# Patient Record
Sex: Female | Born: 1980 | Race: White | Hispanic: No | Marital: Single | Smoking: Never smoker
Health system: Southern US, Community
[De-identification: ages and names within clinical notes are randomized; demographics above are authoritative.]

## PROBLEM LIST (undated history)

## (undated) DIAGNOSIS — Z1371 Encounter for nonprocreative screening for genetic disease carrier status: Secondary | ICD-10-CM

## (undated) DIAGNOSIS — F419 Anxiety disorder, unspecified: Secondary | ICD-10-CM

## (undated) DIAGNOSIS — E282 Polycystic ovarian syndrome: Secondary | ICD-10-CM

## (undated) DIAGNOSIS — F329 Major depressive disorder, single episode, unspecified: Secondary | ICD-10-CM

## (undated) DIAGNOSIS — D134 Benign neoplasm of liver: Secondary | ICD-10-CM

## (undated) DIAGNOSIS — Z803 Family history of malignant neoplasm of breast: Secondary | ICD-10-CM

## (undated) DIAGNOSIS — F32A Depression, unspecified: Secondary | ICD-10-CM

## (undated) DIAGNOSIS — Z9189 Other specified personal risk factors, not elsewhere classified: Secondary | ICD-10-CM

## (undated) HISTORY — DX: Family history of malignant neoplasm of breast: Z80.3

## (undated) HISTORY — PX: CHOLECYSTECTOMY: SHX55

## (undated) HISTORY — DX: Depression, unspecified: F32.A

## (undated) HISTORY — DX: Anxiety disorder, unspecified: F41.9

## (undated) HISTORY — PX: LIVER RESECTION: SHX1977

## (undated) HISTORY — DX: Polycystic ovarian syndrome: E28.2

## (undated) HISTORY — PX: FINGER SURGERY: SHX640

## (undated) HISTORY — DX: Benign neoplasm of liver: D13.4

---
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
## 2019-03-05 DIAGNOSIS — M9902 Segmental and somatic dysfunction of thoracic region: Secondary | ICD-10-CM | POA: Diagnosis not present

## 2019-03-05 DIAGNOSIS — M5431 Sciatica, right side: Secondary | ICD-10-CM | POA: Diagnosis not present

## 2019-03-05 DIAGNOSIS — M546 Pain in thoracic spine: Secondary | ICD-10-CM | POA: Diagnosis not present

## 2019-03-05 DIAGNOSIS — M9903 Segmental and somatic dysfunction of lumbar region: Secondary | ICD-10-CM | POA: Diagnosis not present

## 2019-03-07 ENCOUNTER — Ambulatory Visit: Payer: Self-pay | Admitting: Obstetrics and Gynecology

## 2019-03-09 DIAGNOSIS — M9903 Segmental and somatic dysfunction of lumbar region: Secondary | ICD-10-CM | POA: Diagnosis not present

## 2019-03-09 DIAGNOSIS — M5431 Sciatica, right side: Secondary | ICD-10-CM | POA: Diagnosis not present

## 2019-03-09 DIAGNOSIS — M546 Pain in thoracic spine: Secondary | ICD-10-CM | POA: Diagnosis not present

## 2019-03-09 DIAGNOSIS — M9902 Segmental and somatic dysfunction of thoracic region: Secondary | ICD-10-CM | POA: Diagnosis not present

## 2019-03-12 DIAGNOSIS — M9903 Segmental and somatic dysfunction of lumbar region: Secondary | ICD-10-CM | POA: Diagnosis not present

## 2019-03-12 DIAGNOSIS — M546 Pain in thoracic spine: Secondary | ICD-10-CM | POA: Diagnosis not present

## 2019-03-12 DIAGNOSIS — M9902 Segmental and somatic dysfunction of thoracic region: Secondary | ICD-10-CM | POA: Diagnosis not present

## 2019-03-12 DIAGNOSIS — M5431 Sciatica, right side: Secondary | ICD-10-CM | POA: Diagnosis not present

## 2019-03-15 ENCOUNTER — Encounter: Payer: Self-pay | Admitting: Obstetrics and Gynecology

## 2019-03-15 ENCOUNTER — Other Ambulatory Visit: Payer: Self-pay

## 2019-03-15 ENCOUNTER — Ambulatory Visit (INDEPENDENT_AMBULATORY_CARE_PROVIDER_SITE_OTHER): Payer: Federal, State, Local not specified - PPO | Admitting: Obstetrics and Gynecology

## 2019-03-15 ENCOUNTER — Other Ambulatory Visit (HOSPITAL_COMMUNITY)
Admission: RE | Admit: 2019-03-15 | Discharge: 2019-03-15 | Disposition: A | Discharge status: Home / Self Care | Payer: Federal, State, Local not specified - PPO | Source: Ambulatory Visit | Attending: Obstetrics and Gynecology | Admitting: Obstetrics and Gynecology

## 2019-03-15 VITALS — BP 110/70 | HR 79 | Ht 60.0 in | Wt 215.0 lb

## 2019-03-15 DIAGNOSIS — Z130 Encounter for screening for diseases of the blood and blood-forming organs and certain disorders involving the immune mechanism: Secondary | ICD-10-CM

## 2019-03-15 DIAGNOSIS — Z01419 Encounter for gynecological examination (general) (routine) without abnormal findings: Secondary | ICD-10-CM

## 2019-03-15 DIAGNOSIS — Z0000 Encounter for general adult medical examination without abnormal findings: Secondary | ICD-10-CM

## 2019-03-15 DIAGNOSIS — Z124 Encounter for screening for malignant neoplasm of cervix: Secondary | ICD-10-CM

## 2019-03-15 DIAGNOSIS — Z13220 Encounter for screening for lipoid disorders: Secondary | ICD-10-CM

## 2019-03-15 DIAGNOSIS — Z1329 Encounter for screening for other suspected endocrine disorder: Secondary | ICD-10-CM

## 2019-03-15 DIAGNOSIS — Z1231 Encounter for screening mammogram for malignant neoplasm of breast: Secondary | ICD-10-CM

## 2019-03-15 DIAGNOSIS — Z131 Encounter for screening for diabetes mellitus: Secondary | ICD-10-CM

## 2019-03-15 DIAGNOSIS — E282 Polycystic ovarian syndrome: Secondary | ICD-10-CM

## 2019-03-15 DIAGNOSIS — Z3009 Encounter for other general counseling and advice on contraception: Secondary | ICD-10-CM

## 2019-03-15 MED ORDER — LEVONORGESTREL-ETHINYL ESTRAD 0.1-20 MG-MCG PO TABS: 1.0000 | ORAL_TABLET | Freq: Every day | ORAL | 11 refills | Status: DC

## 2019-03-15 NOTE — Progress Notes (Signed)
Gynecology Annual Exam   PCP: Patient, No Pcp Per  Chief Complaint:  Chief Complaint  Patient presents with  . Gynecologic Exam    History of Present Illness: Patient is a 38 y.o. G0P0000 presents for annual exam. The patient has no complaints today.   LMP: No LMP recorded. (Menstrual status: Irregular Periods). PCOS: history of irregular periods. Took OCP for PCOS on and off for several years. After starting OCPs recently 5 months later she had a ruptured hepatic adenoma.  She has not taking OCPs since that time. She has not had a period either.   The patient is not sexually active. She currently uses none for contraception. She denies dyspareunia.  The patient does perform self breast exams.  There is notable family history of breast or ovarian cancer in her family.  The patient wears seatbelts: yes.   The patient has regular exercise: yes, recently started walking with her sister more.    The patient reports current symptoms of depression.    Review of Systems: Review of Systems  Constitutional: Negative for chills, fever, malaise/fatigue and weight loss.  HENT: Negative for congestion, hearing loss and sinus pain.   Eyes: Negative for blurred vision and double vision.  Respiratory: Negative for cough, sputum production, shortness of breath and wheezing.   Cardiovascular: Negative for chest pain, palpitations, orthopnea and leg swelling.  Gastrointestinal: Negative for abdominal pain, constipation, diarrhea, nausea and vomiting.  Genitourinary: Negative for dysuria, flank pain, frequency, hematuria and urgency.  Musculoskeletal: Negative for back pain, falls and joint pain.  Skin: Negative for itching and rash.  Neurological: Negative for dizziness and headaches.  Psychiatric/Behavioral: Negative for depression, substance abuse and suicidal ideas. The patient is not nervous/anxious.     Past Medical History:  Past Medical History:  Diagnosis Date  . Anxiety   .  Depression   . Hepatic adenoma   . PCOS (polycystic ovarian syndrome)     Past Surgical History:  Past Surgical History:  Procedure Laterality Date  . CHOLECYSTECTOMY    . FINGER SURGERY    . LIVER RESECTION      Gynecologic History:  No LMP recorded. (Menstrual status: Irregular Periods). Contraception: none Last Pap: Results were: unknown   Obstetric History: G0P0000  Family History:  Family History  Problem Relation Age of Onset  . Breast cancer Maternal Aunt 3  . Breast cancer Paternal Aunt 53  . Lung cancer Maternal Grandfather 95  . Breast cancer Paternal Grandmother 20    Social History:  Social History   Socioeconomic History  . Marital status: Single    Spouse name: Not on file  . Number of children: Not on file  . Years of education: Not on file  . Highest education level: Not on file  Occupational History  . Not on file  Social Needs  . Financial resource strain: Not on file  . Food insecurity    Worry: Not on file    Inability: Not on file  . Transportation needs    Medical: Not on file    Non-medical: Not on file  Tobacco Use  . Smoking status: Never Smoker  . Smokeless tobacco: Never Used  Substance and Sexual Activity  . Alcohol use: Not Currently  . Drug use: Never  . Sexual activity: Not Currently    Birth control/protection: None  Lifestyle  . Physical activity    Days per week: Not on file    Minutes per session: Not on file  .  Stress: Not on file  Relationships  . Social Herbalist on phone: Not on file    Gets together: Not on file    Attends religious service: Not on file    Active member of club or organization: Not on file    Attends meetings of clubs or organizations: Not on file    Relationship status: Not on file  . Intimate partner violence    Fear of current or ex partner: Not on file    Emotionally abused: Not on file    Physically abused: Not on file    Forced sexual activity: Not on file  Other Topics  Concern  . Not on file  Social History Narrative  . Not on file    Allergies:  Allergies  Allergen Reactions  . Penicillins Other (See Comments)    Not to sure     Medications: Prior to Admission medications   Medication Sig Start Date End Date Taking? Authorizing Provider  venlafaxine XR (EFFEXOR-XR) 75 MG 24 hr capsule  12/07/18  Yes [provider]  levonorgestrel-ethinyl estradiol (AVIANE) 0.1-20 MG-MCG tablet Take 1 tablet by mouth daily. 03/15/19   Homero Fellers, MD    Physical Exam Vitals: Blood pressure 110/70, pulse 79, height 5' (1.524 m), weight 215 lb (97.5 kg).  General: NAD HEENT: normocephalic, anicteric Thyroid: no enlargement, no palpable nodules Pulmonary: No increased work of breathing, CTAB Cardiovascular: RRR, distal pulses 2+ Breast: Breast symmetrical, no tenderness, no palpable nodules or masses, no skin or nipple retraction present, no nipple discharge.  No axillary or supraclavicular lymphadenopathy. Abdomen: NABS, soft, non-tender, non-distended.  Umbilicus without lesions.  No hepatomegaly, splenomegaly or masses palpable. No evidence of hernia  Genitourinary:  External: Normal external female genitalia.  Normal urethral meatus, normal Bartholin's and Skene's glands.    Vagina: Normal vaginal mucosa, no evidence of prolapse.    Cervix: Grossly normal in appearance, no bleeding  Uterus: Non-enlarged, mobile, normal contour.  No CMT  Adnexa: ovaries non-enlarged, no adnexal masses  Rectal: deferred  Lymphatic: no evidence of inguinal lymphadenopathy Extremities: no edema, erythema, or tenderness Neurologic: Grossly intact Psychiatric: mood appropriate, affect full  Female chaperone present for pelvic and breast  portions of the physical exam  Assessment: 38 y.o. G0P0000 routine annual exam  Plan: Problem List Items Addressed This Visit    None    Visit Diagnoses    Birth control counseling    -  Primary   Healthcare  maintenance       Relevant Orders   Comprehensive metabolic panel   Screening cholesterol level       Relevant Orders   Lipid panel   Screening for diabetes mellitus       Relevant Orders   Comprehensive metabolic panel   Screening for deficiency anemia       Relevant Orders   CBC With Differential   Screening for thyroid disorder       Relevant Orders   T3, free   T4, free   TSH   Encounter for screening mammogram for high-risk patient       Relevant Orders   MM DIGITAL SCREENING BILATERAL   Screening for cervical cancer       Relevant Orders   Cytology - PAP   PCOS (polycystic ovarian syndrome)          2) STI screening  was offered and declined  2)  ASCCP guidelines and rational discussed.  Patient opts for every  5 years screening interval  3) Contraception - the patient is currently using  none.  She is happy with her current form of contraception and plans to continue. This issue will need to be discussed more with her liver doctor because of her history of hepatic adenomas. She had a liver rupture and most birth control medications are listed as a level 3, CHC is a level 4. She does have PCOS though and some management of her periods would be ideal to prevent endometrial overgrowth.   4) Routine healthcare maintenance including cholesterol, diabetes screening discussed To return fasting at a later date  5) Return in about 4 weeks (around 04/12/2019) for GYN visit, fasting labs ASAP.  Adrian Prows MD [facility], [facility] 03/15/2019 5:38 PM

## 2019-03-16 ENCOUNTER — Other Ambulatory Visit: Payer: Federal, State, Local not specified - PPO

## 2019-03-16 ENCOUNTER — Other Ambulatory Visit: Payer: Self-pay

## 2019-03-16 DIAGNOSIS — Z0000 Encounter for general adult medical examination without abnormal findings: Secondary | ICD-10-CM

## 2019-03-16 DIAGNOSIS — M9903 Segmental and somatic dysfunction of lumbar region: Secondary | ICD-10-CM | POA: Diagnosis not present

## 2019-03-16 DIAGNOSIS — Z130 Encounter for screening for diseases of the blood and blood-forming organs and certain disorders involving the immune mechanism: Secondary | ICD-10-CM

## 2019-03-16 DIAGNOSIS — Z131 Encounter for screening for diabetes mellitus: Secondary | ICD-10-CM

## 2019-03-16 DIAGNOSIS — M5431 Sciatica, right side: Secondary | ICD-10-CM | POA: Diagnosis not present

## 2019-03-16 DIAGNOSIS — Z13220 Encounter for screening for lipoid disorders: Secondary | ICD-10-CM | POA: Diagnosis not present

## 2019-03-16 DIAGNOSIS — M9902 Segmental and somatic dysfunction of thoracic region: Secondary | ICD-10-CM | POA: Diagnosis not present

## 2019-03-16 DIAGNOSIS — M546 Pain in thoracic spine: Secondary | ICD-10-CM | POA: Diagnosis not present

## 2019-03-16 DIAGNOSIS — Z1329 Encounter for screening for other suspected endocrine disorder: Secondary | ICD-10-CM

## 2019-03-17 LAB — COMPREHENSIVE METABOLIC PANEL
ALT: 19 IU/L (ref 0–32)
AST: 15 IU/L (ref 0–40)
Albumin/Globulin Ratio: 1.3 (ref 1.2–2.2)
Albumin: 4.3 g/dL (ref 3.8–4.8)
Alkaline Phosphatase: 93 IU/L (ref 39–117)
BUN/Creatinine Ratio: 23 (ref 9–23)
BUN: 16 mg/dL (ref 6–20)
Bilirubin Total: <0.2 mg/dL (ref 0.0–1.2)
CO2: 21 mmol/L (ref 20–29)
Calcium: 9.3 mg/dL (ref 8.7–10.2)
Chloride: 99 mmol/L (ref 96–106)
Creatinine, Ser: 0.69 mg/dL (ref 0.57–1.00)
GFR calc Af Amer: 129 mL/min/{1.73_m2} (ref >59)
GFR calc non Af Amer: 112 mL/min/{1.73_m2} (ref >59)
Globulin, Total: 3.2 g/dL (ref 1.5–4.5)
Glucose: 89 mg/dL (ref 65–99)
Potassium: 4.5 mmol/L (ref 3.5–5.2)
Sodium: 138 mmol/L (ref 134–144)
Total Protein: 7.5 g/dL (ref 6.0–8.5)

## 2019-03-17 LAB — LIPID PANEL
Chol/HDL Ratio: 5.1 ratio — ABNORMAL HIGH (ref 0.0–4.4)
Cholesterol, Total: 214 mg/dL — ABNORMAL HIGH (ref 100–199)
HDL: 42 mg/dL (ref >39)
LDL Calculated: 124 mg/dL — ABNORMAL HIGH (ref 0–99)
Triglycerides: 240 mg/dL — ABNORMAL HIGH (ref 0–149)
VLDL Cholesterol Cal: 48 mg/dL — ABNORMAL HIGH (ref 5–40)

## 2019-03-17 LAB — CBC WITH DIFFERENTIAL
Basophils Absolute: 0.1 10*3/uL (ref 0.0–0.2)
Basos: 1 %
EOS (ABSOLUTE): 0.1 10*3/uL (ref 0.0–0.4)
Eos: 2 %
Hematocrit: 43.9 % (ref 34.0–46.6)
Hemoglobin: 14.1 g/dL (ref 11.1–15.9)
Immature Grans (Abs): 0 10*3/uL (ref 0.0–0.1)
Immature Granulocytes: 0 %
Lymphocytes Absolute: 2.4 10*3/uL (ref 0.7–3.1)
Lymphs: 26 %
MCH: 26 pg — ABNORMAL LOW (ref 26.6–33.0)
MCHC: 32.1 g/dL (ref 31.5–35.7)
MCV: 81 fL (ref 79–97)
Monocytes Absolute: 0.5 10*3/uL (ref 0.1–0.9)
Monocytes: 6 %
Neutrophils Absolute: 5.9 10*3/uL (ref 1.4–7.0)
Neutrophils: 65 %
RBC: 5.43 x10E6/uL — ABNORMAL HIGH (ref 3.77–5.28)
RDW: 14 % (ref 11.7–15.4)
WBC: 9 10*3/uL (ref 3.4–10.8)

## 2019-03-17 LAB — T3, FREE: T3, Free: 3.4 pg/mL (ref 2.0–4.4)

## 2019-03-17 LAB — TSH: TSH: 2.45 u[IU]/mL (ref 0.450–4.500)

## 2019-03-17 LAB — T4, FREE: Free T4: 1.12 ng/dL (ref 0.82–1.77)

## 2019-03-19 DIAGNOSIS — M546 Pain in thoracic spine: Secondary | ICD-10-CM | POA: Diagnosis not present

## 2019-03-19 DIAGNOSIS — M9902 Segmental and somatic dysfunction of thoracic region: Secondary | ICD-10-CM | POA: Diagnosis not present

## 2019-03-19 DIAGNOSIS — M5431 Sciatica, right side: Secondary | ICD-10-CM | POA: Diagnosis not present

## 2019-03-19 DIAGNOSIS — M9903 Segmental and somatic dysfunction of lumbar region: Secondary | ICD-10-CM | POA: Diagnosis not present

## 2019-03-19 LAB — CYTOLOGY - PAP
Diagnosis: NEGATIVE
HPV: NOT DETECTED

## 2019-03-24 DIAGNOSIS — Z9189 Other specified personal risk factors, not elsewhere classified: Secondary | ICD-10-CM

## 2019-03-24 DIAGNOSIS — Z1371 Encounter for nonprocreative screening for genetic disease carrier status: Secondary | ICD-10-CM

## 2019-03-24 HISTORY — DX: Encounter for nonprocreative screening for genetic disease carrier status: Z13.71

## 2019-03-24 HISTORY — DX: Other specified personal risk factors, not elsewhere classified: Z91.89

## 2019-03-26 DIAGNOSIS — M9902 Segmental and somatic dysfunction of thoracic region: Secondary | ICD-10-CM | POA: Diagnosis not present

## 2019-03-26 DIAGNOSIS — M5431 Sciatica, right side: Secondary | ICD-10-CM | POA: Diagnosis not present

## 2019-03-26 DIAGNOSIS — M546 Pain in thoracic spine: Secondary | ICD-10-CM | POA: Diagnosis not present

## 2019-03-26 DIAGNOSIS — M9903 Segmental and somatic dysfunction of lumbar region: Secondary | ICD-10-CM | POA: Diagnosis not present

## 2019-03-27 NOTE — Progress Notes (Signed)
Please call patient with normal pap smear result

## 2019-03-30 NOTE — Progress Notes (Signed)
Pt aware via VM

## 2019-04-02 ENCOUNTER — Encounter: Payer: Self-pay | Admitting: Obstetrics and Gynecology

## 2019-04-06 DIAGNOSIS — F330 Major depressive disorder, recurrent, mild: Secondary | ICD-10-CM | POA: Diagnosis not present

## 2019-04-10 DIAGNOSIS — D134 Benign neoplasm of liver: Secondary | ICD-10-CM | POA: Diagnosis not present

## 2019-04-16 ENCOUNTER — Encounter: Payer: Self-pay | Admitting: Obstetrics and Gynecology

## 2019-04-16 ENCOUNTER — Other Ambulatory Visit: Payer: Self-pay

## 2019-04-16 ENCOUNTER — Ambulatory Visit (INDEPENDENT_AMBULATORY_CARE_PROVIDER_SITE_OTHER): Payer: Federal, State, Local not specified - PPO | Admitting: Obstetrics and Gynecology

## 2019-04-16 ENCOUNTER — Ambulatory Visit
Admission: RE | Admit: 2019-04-16 | Discharge: 2019-04-16 | Disposition: A | Discharge status: Home / Self Care | Payer: Federal, State, Local not specified - PPO | Source: Ambulatory Visit | Attending: Obstetrics and Gynecology | Admitting: Obstetrics and Gynecology

## 2019-04-16 VITALS — BP 118/70 | Ht 60.0 in | Wt 217.0 lb

## 2019-04-16 DIAGNOSIS — Z1231 Encounter for screening mammogram for malignant neoplasm of breast: Secondary | ICD-10-CM | POA: Diagnosis not present

## 2019-04-16 DIAGNOSIS — Z803 Family history of malignant neoplasm of breast: Secondary | ICD-10-CM | POA: Diagnosis not present

## 2019-04-16 IMAGING — MG MM DIGITAL SCREENING BILAT W/ TOMO W/ CAD
6 of 10 series · 6 of 30 positions shown · non-contrast
Comparison: Previous exams [DATE] and earlier from [facility]
[place] [facility] [place].

CLINICAL DATA: Screening.

EXAM:
DIGITAL SCREENING BILATERAL MAMMOGRAM WITH TOMO AND CAD

[R CC synth-2D]
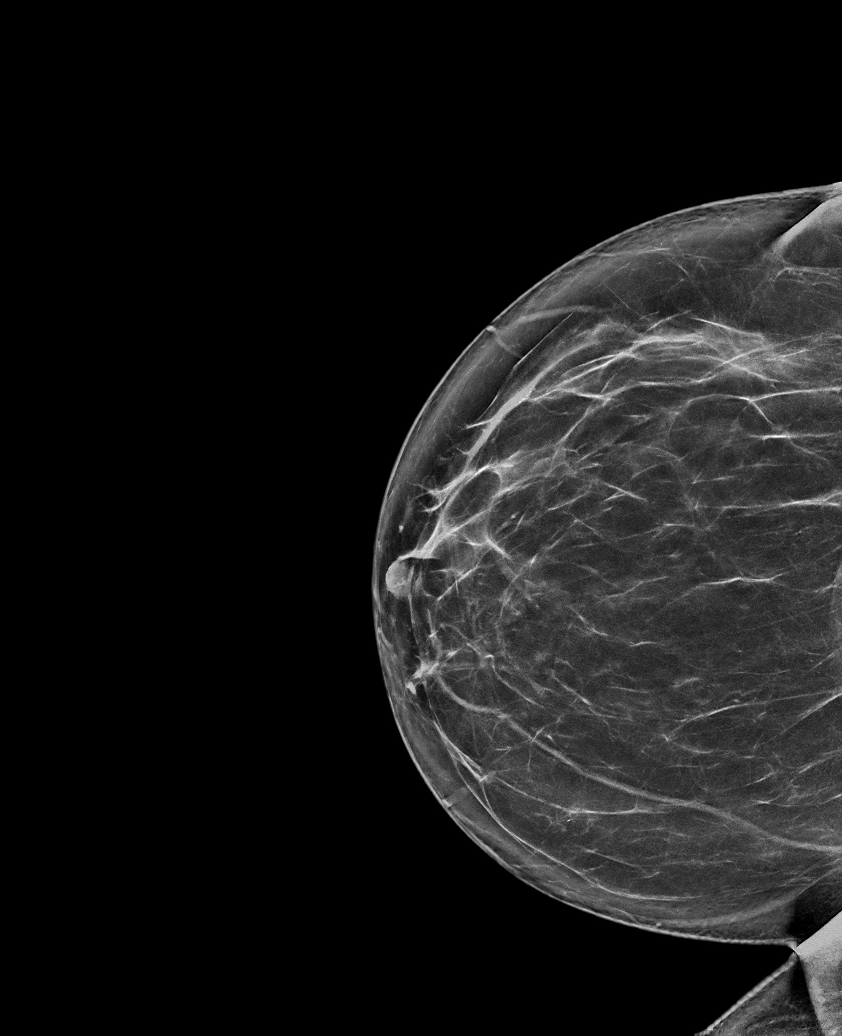

[R MLO synth-2D (1 of 2)]
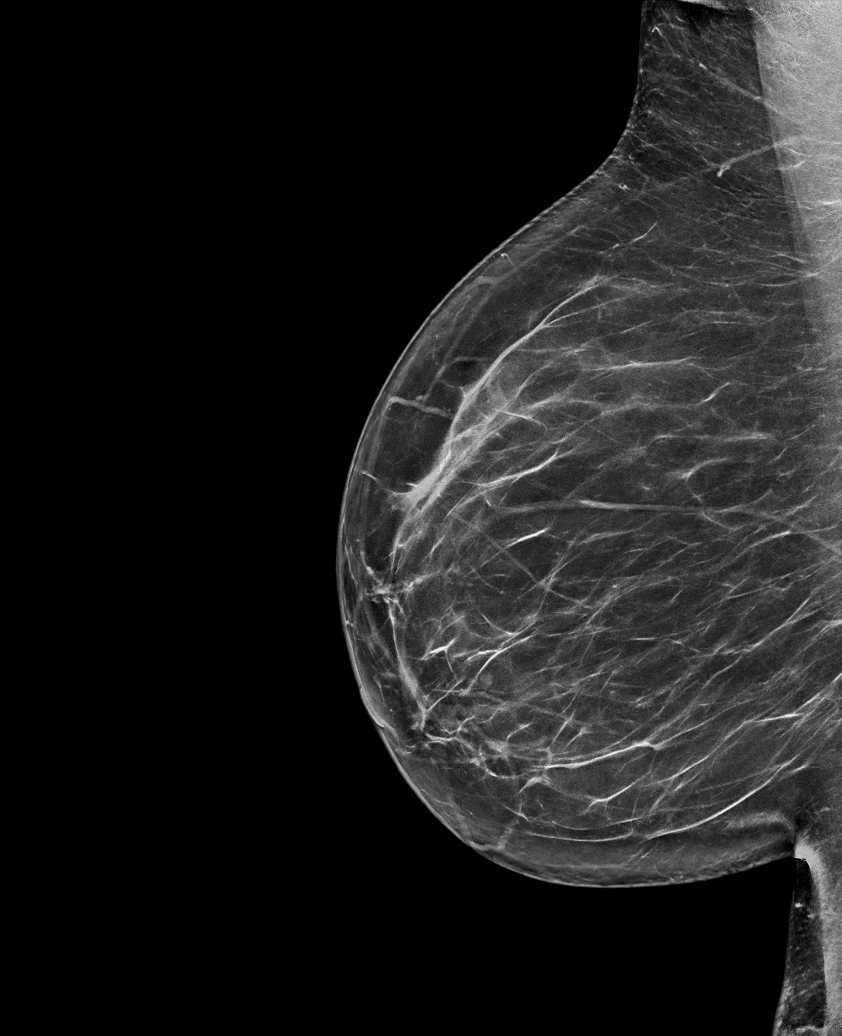

[L MLO synth-2D]
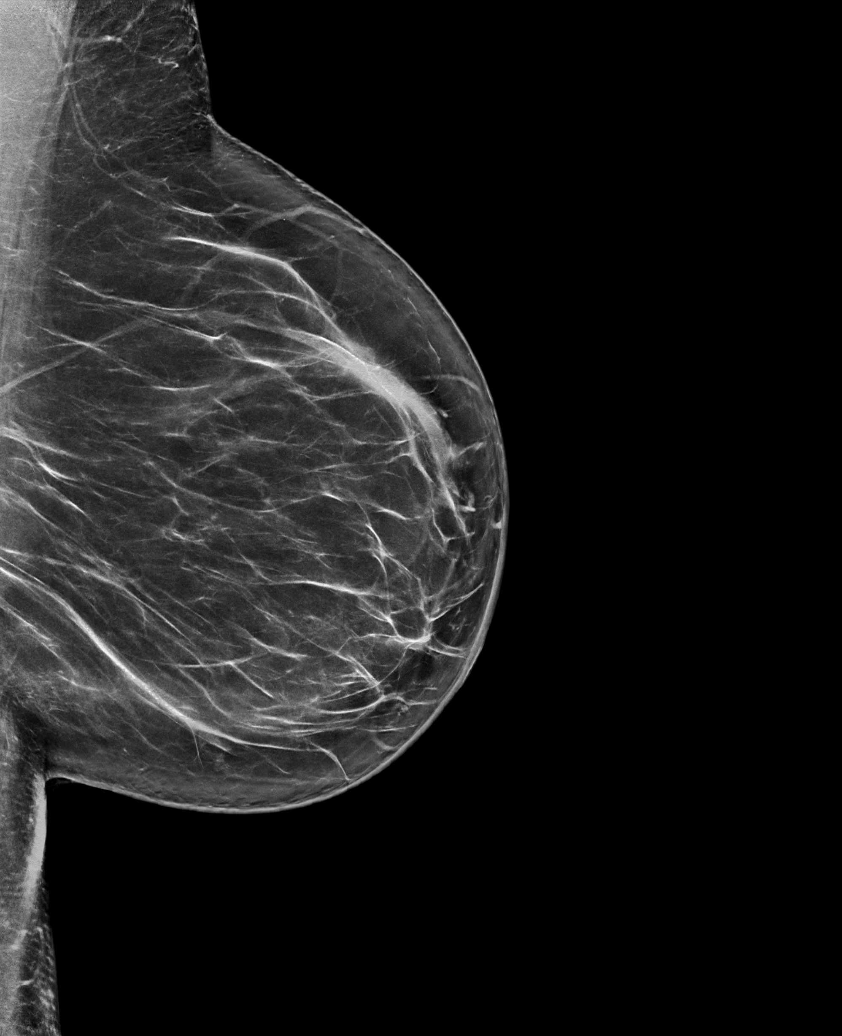

[R MLO synth-2D (2 of 2)]
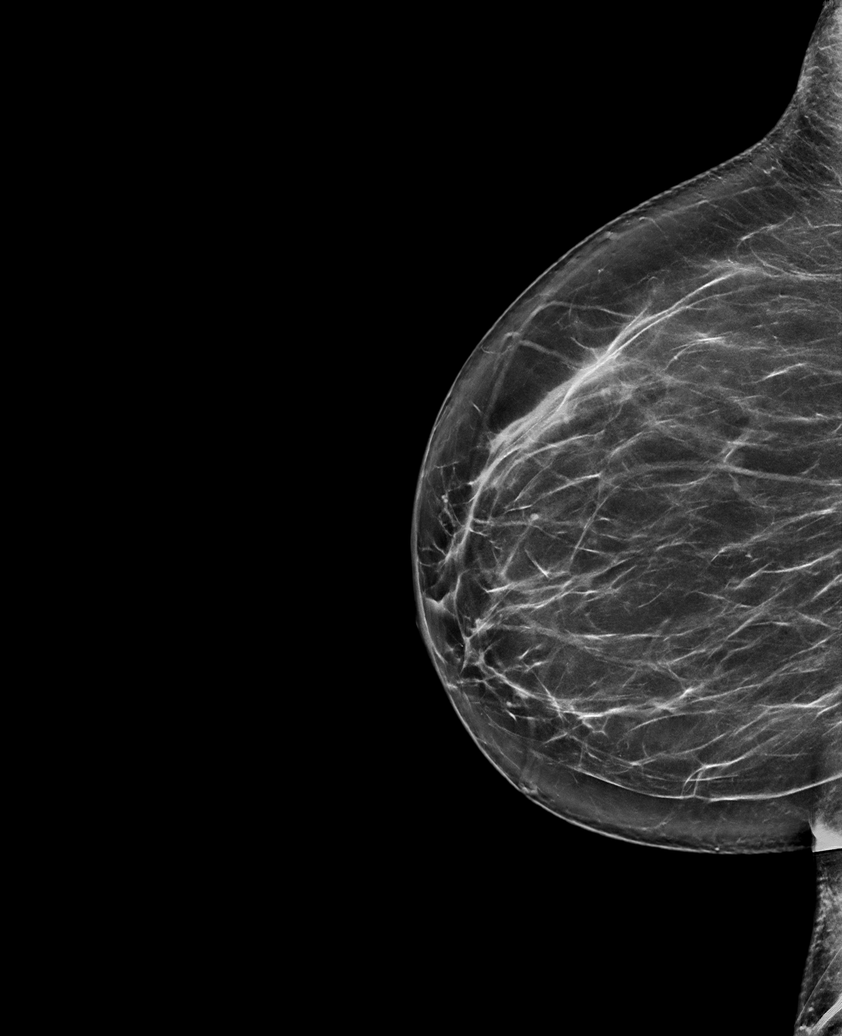

[L CC synth-2D]
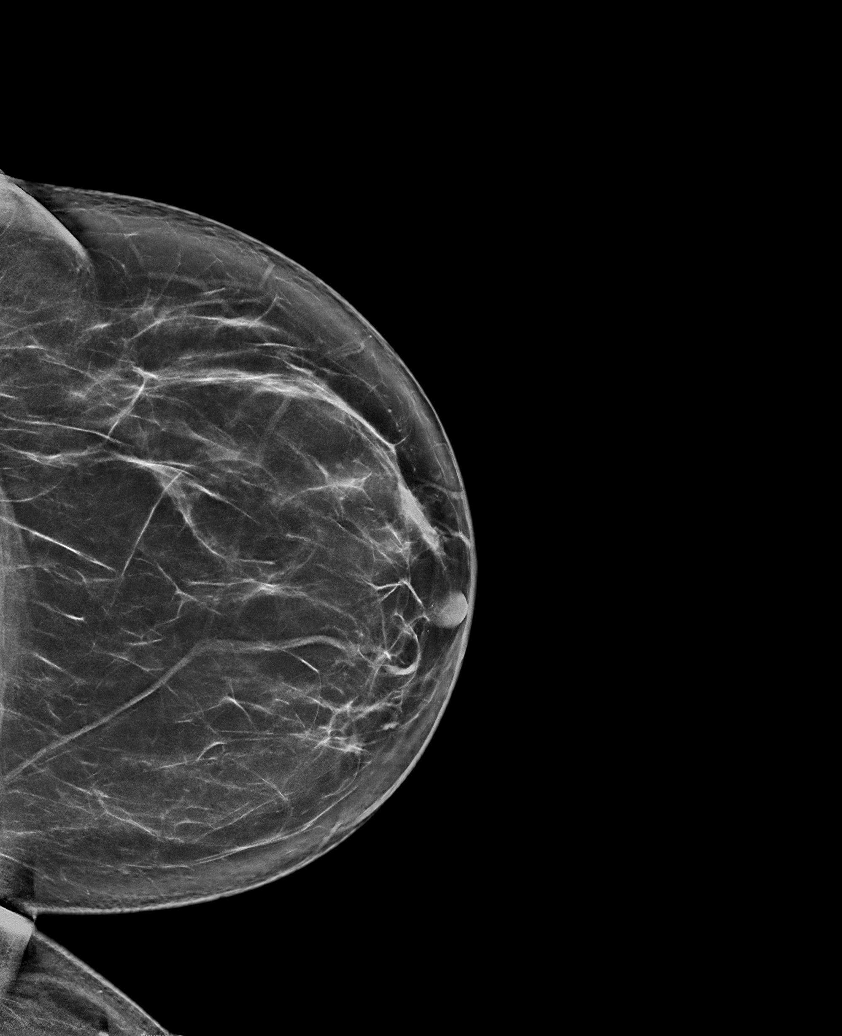

[R CC tomo · tomo slice 41/80.0]
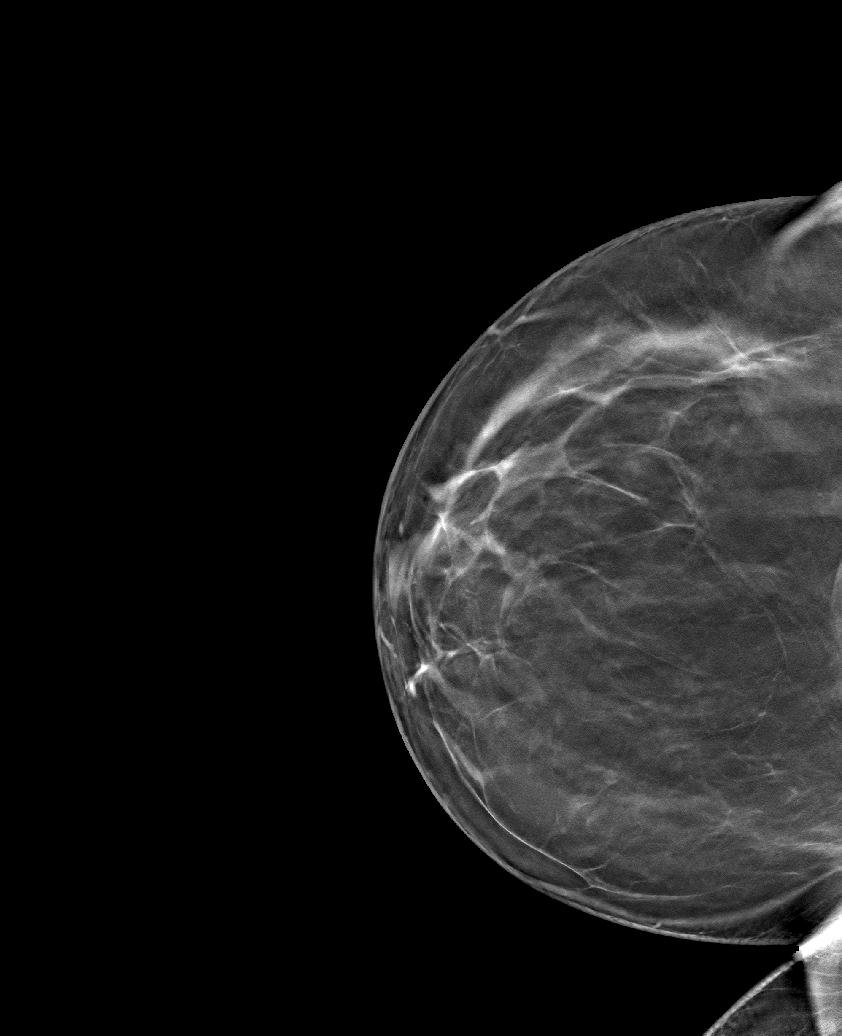

[6 of 30 positions shown; findings below may reference images not displayed]

Awaiting the prior outside mammograms accounts for the delay in this
report.

ACR Breast Density Category b: There are scattered areas of
fibroglandular density.
FINDINGS: There are no findings suspicious for malignancy. Images were
processed with CAD.
IMPRESSION: No mammographic evidence of malignancy. A result letter of this
screening mammogram will be mailed directly to the patient.

RECOMMENDATION:
Screening mammogram at age 41. (Code:[T9])

BI-RADS CATEGORY  1: Negative.

## 2019-04-16 NOTE — Progress Notes (Signed)
Patient ID: Amanda Rasmussen, female   DOB: 1980/09/05, 38 y.o.   MRN: [id number]  Reason for Consult: Follow-up [facility] Results )   Referred by No ref. provider found  Subjective:     HPI:  Amanda Rasmussen is a 38 y.o. female . She is following up today to discuss her myrisk testing result. She is otherwise feeling well and has a breast mammogram scheduled for later today.   Past Medical History:  Diagnosis Date  . Anxiety   . BRCA negative 03/2019   MyRisk neg except MSH3 VUS  . Depression   . Family history of breast cancer   . Hepatic adenoma   . Increased risk of breast cancer 03/2019   IBIS=27.0%/riskscore=19.3%  . PCOS (polycystic ovarian syndrome)    Family History  Problem Relation Age of Onset  . Breast cancer Maternal Aunt 89  . Breast cancer Paternal Aunt 7  . Lung cancer Maternal Grandfather 58  . Breast cancer Paternal Grandmother 57  . Prostate cancer Maternal Uncle 60   Past Surgical History:  Procedure Laterality Date  . CHOLECYSTECTOMY    . FINGER SURGERY    . LIVER RESECTION      Short Social History:  Social History   Tobacco Use  . Smoking status: Never Smoker  . Smokeless tobacco: Never Used  Substance Use Topics  . Alcohol use: Not Currently    Allergies  Allergen Reactions  . Penicillins Other (See Comments)    Not to sure     Current Outpatient Medications  Medication Sig Dispense Refill  . desvenlafaxine (PRISTIQ) 50 MG 24 hr tablet      No current facility-administered medications for this visit.     Review of Systems  Constitutional: Negative for chills, fatigue, fever and unexpected weight change.  HENT: Negative for trouble swallowing.  Eyes: Negative for loss of vision.  Respiratory: Negative for cough, shortness of breath and wheezing.  Cardiovascular: Negative for chest pain, leg swelling, palpitations and syncope.  GI: Negative for abdominal pain, blood in stool, diarrhea, nausea and vomiting.  GU:  Negative for difficulty urinating, dysuria, frequency and hematuria.  Musculoskeletal: Negative for back pain, leg pain and joint pain.  Skin: Negative for rash.  Neurological: Negative for dizziness, headaches, light-headedness, numbness and seizures.  Psychiatric: Negative for behavioral problem, confusion, depressed mood and sleep disturbance.       Objective:  Objective   Vitals:   04/16/19 0858  BP: 118/70  Weight: 217 lb (98.4 kg)  Height: 5' (1.524 m)   Body mass index is 42.38 kg/m.  Physical Exam Vitals signs and nursing note reviewed.  Constitutional:      Appearance: She is well-developed.  HENT:     Head: Normocephalic and atraumatic.  Eyes:     Pupils: Pupils are equal, round, and reactive to light.  Cardiovascular:     Rate and Rhythm: Normal rate and regular rhythm.  Pulmonary:     Effort: Pulmonary effort is normal. No respiratory distress.  Skin:    General: Skin is warm and dry.  Neurological:     Mental Status: She is alert and oriented to person, place, and time.  Psychiatric:        Behavior: Behavior normal.        Thought Content: Thought content normal.        Judgment: Judgment normal.        Assessment/Plan:     38 yo with an elevated risk for breast cancer Reviewed  myrisk result with patient.  Continue with breast mammogram annually and breast MRI following the mammogram within 6 months of mammogram. Patient has upcoming appointment with hepatologist and will discuss irregular periods with them. She continues with weight loss and healthy lifestyle.    More than 15 minutes were spent face to face with the patient in the room with more than 50% of the time spent providing counseling and discussing the plan of management.     Adrian Prows MD [facility], [facility] 04/16/2019 9:35 AM

## 2019-04-20 DIAGNOSIS — K769 Liver disease, unspecified: Secondary | ICD-10-CM | POA: Diagnosis not present

## 2019-04-20 DIAGNOSIS — D134 Benign neoplasm of liver: Secondary | ICD-10-CM | POA: Diagnosis not present

## 2019-04-26 NOTE — Progress Notes (Signed)
Birads 1- released to [employer]

## 2019-05-21 DIAGNOSIS — F3342 Major depressive disorder, recurrent, in full remission: Secondary | ICD-10-CM | POA: Diagnosis not present

## 2019-08-27 ENCOUNTER — Other Ambulatory Visit: Payer: Self-pay

## 2019-08-27 ENCOUNTER — Ambulatory Visit
Admission: RE | Admit: 2019-08-27 | Discharge: 2019-08-27 | Disposition: A | Discharge status: Home / Self Care | Payer: Federal, State, Local not specified - PPO | Source: Ambulatory Visit | Attending: Obstetrics and Gynecology | Admitting: Obstetrics and Gynecology

## 2019-08-27 DIAGNOSIS — Z1231 Encounter for screening mammogram for malignant neoplasm of breast: Secondary | ICD-10-CM | POA: Diagnosis not present

## 2019-08-27 DIAGNOSIS — N6489 Other specified disorders of breast: Secondary | ICD-10-CM | POA: Diagnosis not present

## 2019-08-27 IMAGING — MR MR BREAST BILAT WO/W CM
2 of 9 series · 6 of 48 positions shown · IV contrast (9ml Gadavist)
Comparison: Previous exam(s).

CLINICAL DATA: 38-year-old female with a very strong family history
of breast cancer presenting for high risk screening MRI.

LABS:  None performed on site.
EXAM:
BILATERAL BREAST MRI WITH AND WITHOUT CONTRAST
TECHNIQUE: Multiplanar, multisequence MR images of both breasts were obtained
prior to and following the intravenous administration of 9 ml of
Gadavist.

[Series 2: T1 · axial · B · 1.5mm · 1.05mm/px · z∈[-41,+125]mm · 5 of 112 slices shown]
[im 1/112]
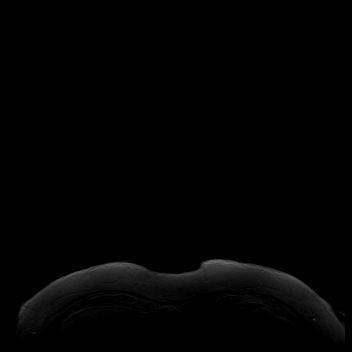
[im 28/112]
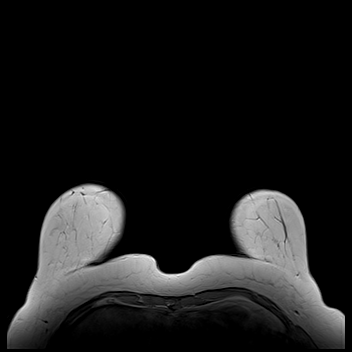
[im 56/112]
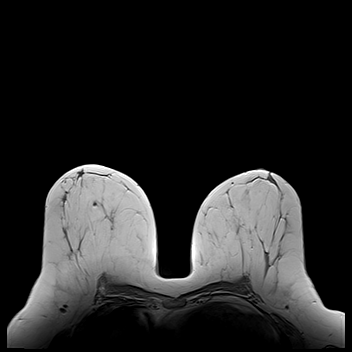
[im 84/112]
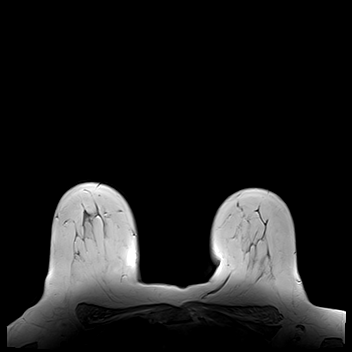
[im 112/112]
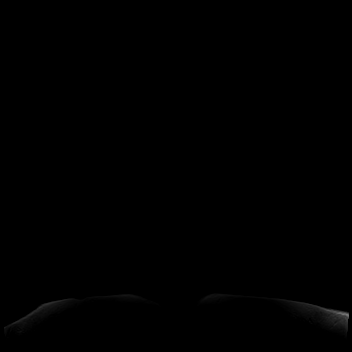

[Series 3: T2 · axial · B · 3.0mm · 1.05mm/px · 1 of 43 slices shown]
[im 1/43]
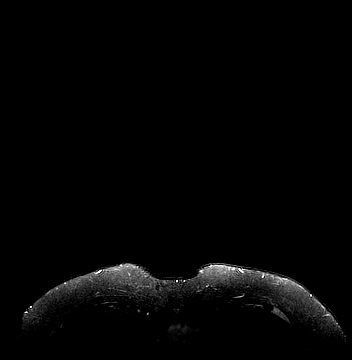

[6 of 48 positions shown; findings below may reference images not displayed]

Three-dimensional MR images were rendered by post-processing of the
original MR data on an independent workstation. The
three-dimensional MR images were interpreted, and findings are
reported in the following complete MRI report for this study. Three
dimensional images were evaluated at the independent [employer]
workstation
FINDINGS: Breast composition: b. Scattered fibroglandular tissue.

Background parenchymal enhancement: Moderate.

Right breast: There are several 3-4 mm enhancing foci scattered
throughout the right breast. A slightly irregular 4 mm focus without
associated T2 correlate is seen in the upper outer quadrant
anteriorly (series 7, image 54/112). A 5 mm focus is seen in the
superior central aspect anteriorly (series 7, image 56/112).
Additional foci are noted as marked on PACS series 7.

Left breast: Suspicious non mass enhancement is identified in the
upper outer quadrant extending from mid to anterior depth (series 7,
images 70-79/112). It spans 5 x 1.2 x 0.9 cm (AP by transverse by
craniocaudal dimensions). No other dominant mass or suspicious
enhancement.

Lymph nodes: No abnormal appearing lymph nodes.

Ancillary findings:  None.
IMPRESSION: 1. Suspicious non-mass enhancement in the upper outer left breast
spanning from mid to anterior depth by approximately 5 cm.
Recommendation is for MRI guided biopsy along the anterior and
posterior aspects.
2. Several indeterminate 3-4 mm enhancing foci scattered throughout
the right breast. A 4 mm focus in the upper outer quadrant (series
7, image 54/112) demonstrates a slightly irregular shape without T2
correlate. Recommendation is that the patient return on a separate
day for 2 area MRI guided biopsy of the right breast to include this
4 mm focus as well as 1 additional focus at the discretion of the
performing radiologist.
3. No suspicious lymphadenopathy.

RECOMMENDATION:
A total of 4 MRI guided biopsies are recommended. The should be
performed on 2 separate days, with the patient returning first for a
2 area biopsy on the left and subsequently for a 2 area biopsy on
the right.

BI-RADS CATEGORY  4: Suspicious.

## 2019-08-27 MED ORDER — GADOBUTROL 1 MMOL/ML IV SOLN
9.0000 mL | Freq: Once | INTRAVENOUS | Status: AC | PRN
  Administered 2019-08-27: 9 mL via INTRAVENOUS

## 2019-08-29 ENCOUNTER — Other Ambulatory Visit: Payer: Self-pay | Admitting: Obstetrics and Gynecology

## 2019-08-29 DIAGNOSIS — R928 Other abnormal and inconclusive findings on diagnostic imaging of breast: Secondary | ICD-10-CM

## 2019-09-14 ENCOUNTER — Ambulatory Visit
Admission: RE | Admit: 2019-09-14 | Discharge: 2019-09-14 | Disposition: A | Discharge status: Home / Self Care | Payer: Federal, State, Local not specified - PPO | Source: Ambulatory Visit | Attending: Obstetrics and Gynecology | Admitting: Obstetrics and Gynecology

## 2019-09-14 ENCOUNTER — Other Ambulatory Visit (HOSPITAL_COMMUNITY): Payer: Self-pay | Admitting: Diagnostic Radiology

## 2019-09-14 ENCOUNTER — Other Ambulatory Visit: Payer: Self-pay

## 2019-09-14 DIAGNOSIS — R928 Other abnormal and inconclusive findings on diagnostic imaging of breast: Secondary | ICD-10-CM

## 2019-09-14 DIAGNOSIS — D242 Benign neoplasm of left breast: Secondary | ICD-10-CM | POA: Diagnosis not present

## 2019-09-14 DIAGNOSIS — N6012 Diffuse cystic mastopathy of left breast: Secondary | ICD-10-CM | POA: Diagnosis not present

## 2019-09-14 DIAGNOSIS — N6489 Other specified disorders of breast: Secondary | ICD-10-CM | POA: Diagnosis not present

## 2019-09-14 IMAGING — MG MM BREAST LOCALIZATION CLIP
4 series · 4 of 12 positions shown · non-contrast
Comparison: Previous exam(s).

CLINICAL DATA: Status post MR guided core needle biopsies of the
anterior posterior aspects of a 5 cm area of non mass enhancement in
the upper-outer quadrant of the left breast.

EXAM:
DIAGNOSTIC LEFT MAMMOGRAM POST MRI BIOPSY X 2

[L ML synth-2D]
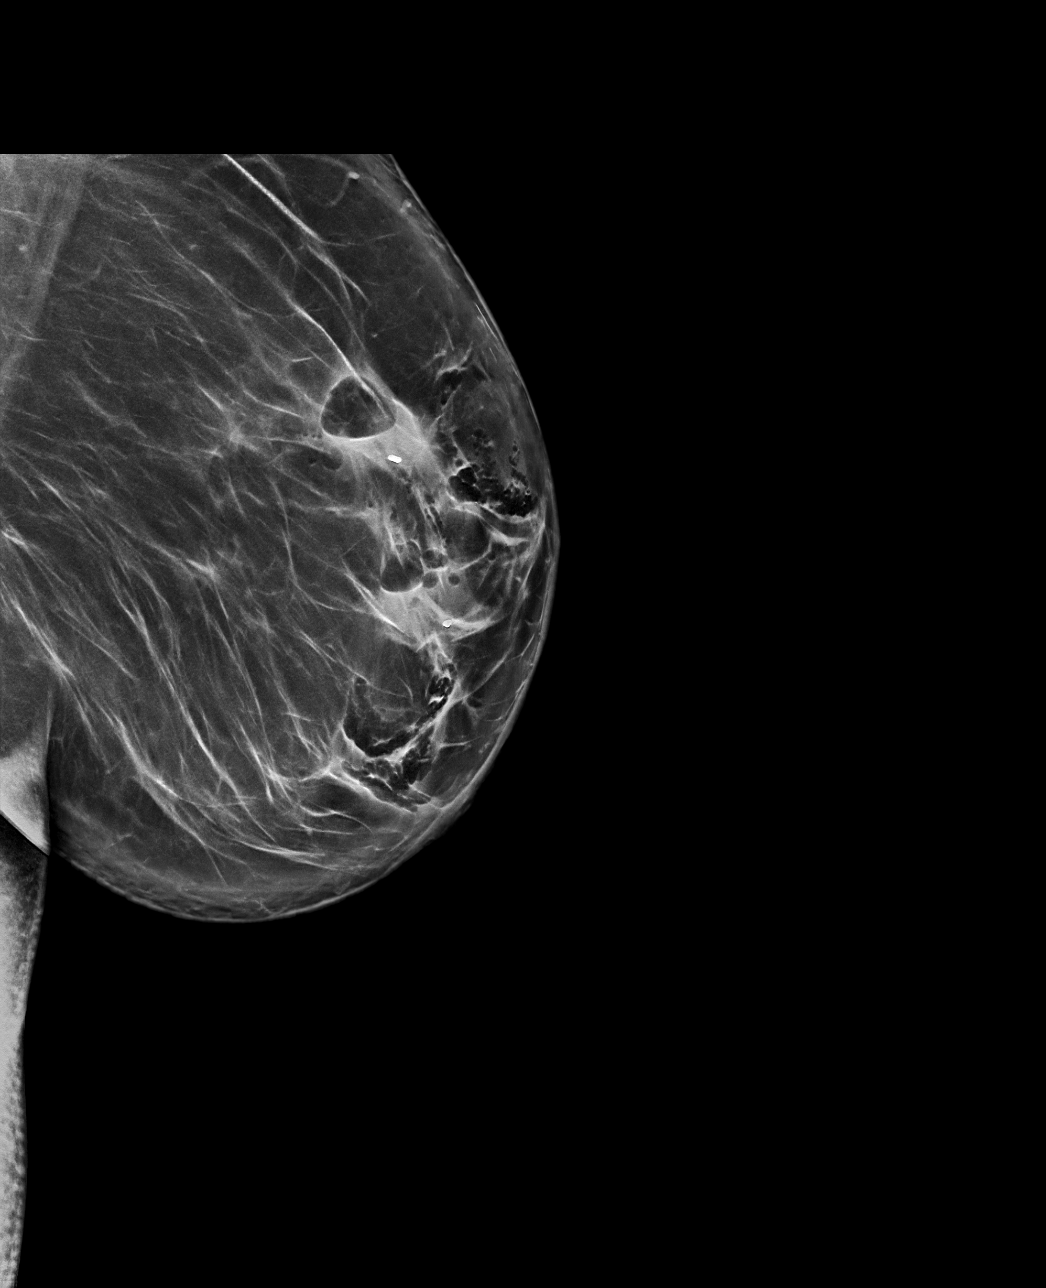

[L CC synth-2D]
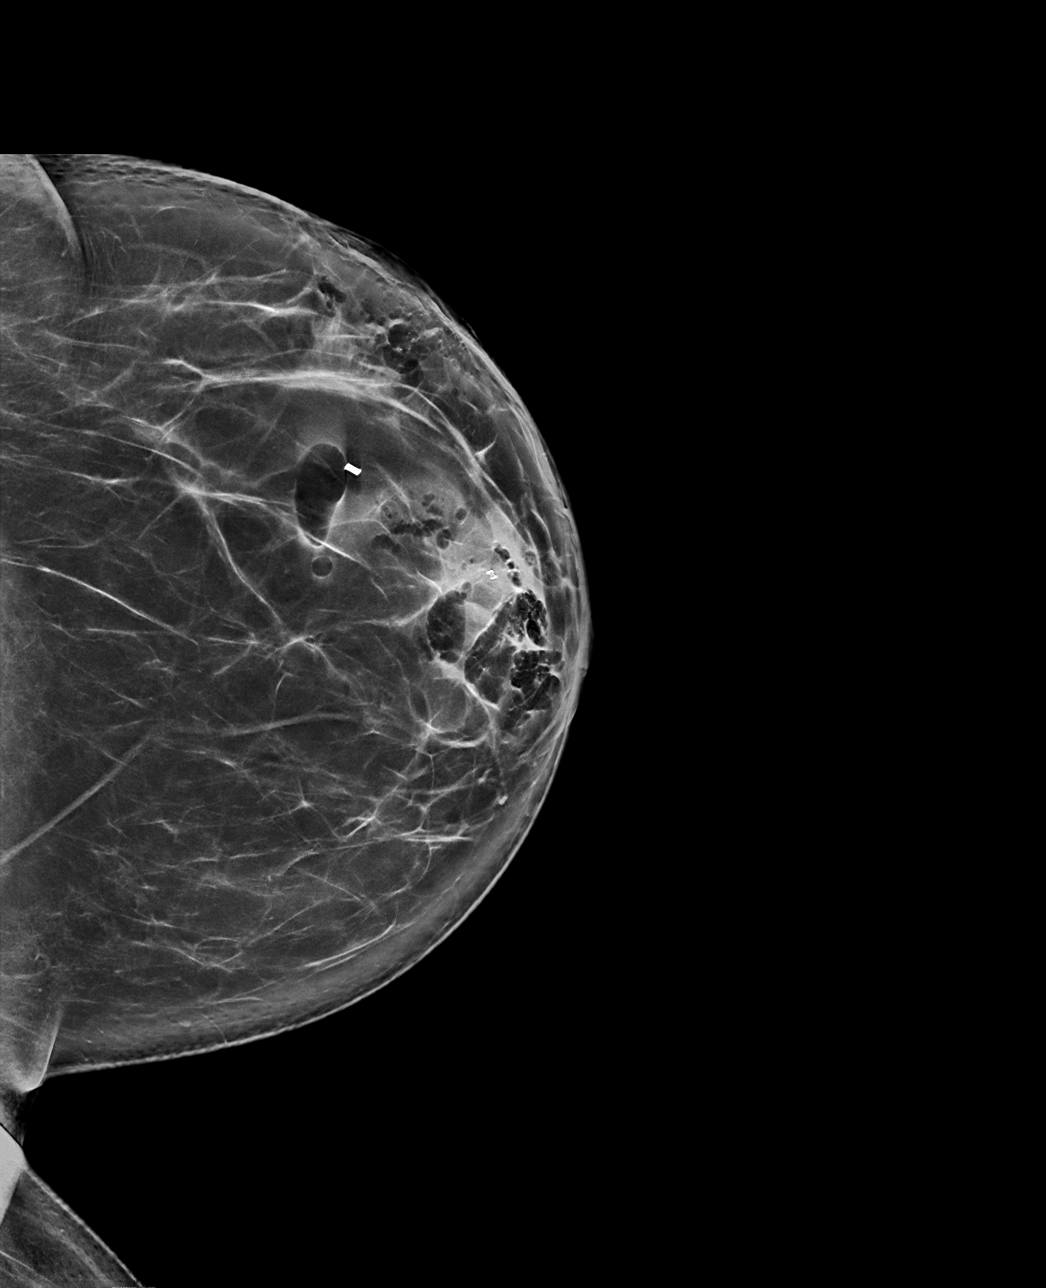

[L CC tomo · tomo slice 47/93.0]
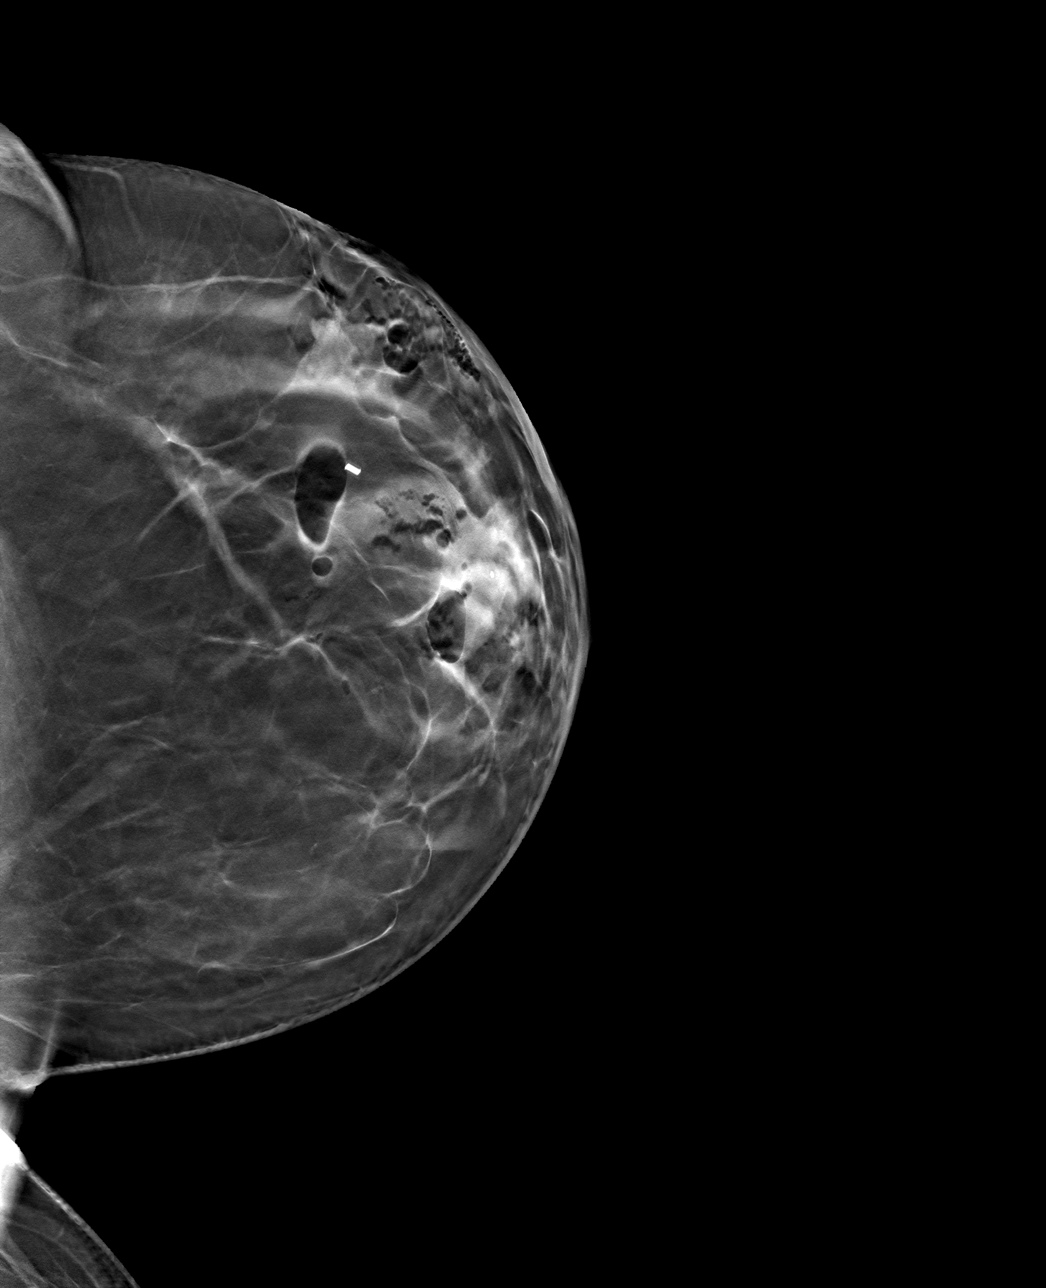

[L ML tomo · tomo slice 54/107.0]
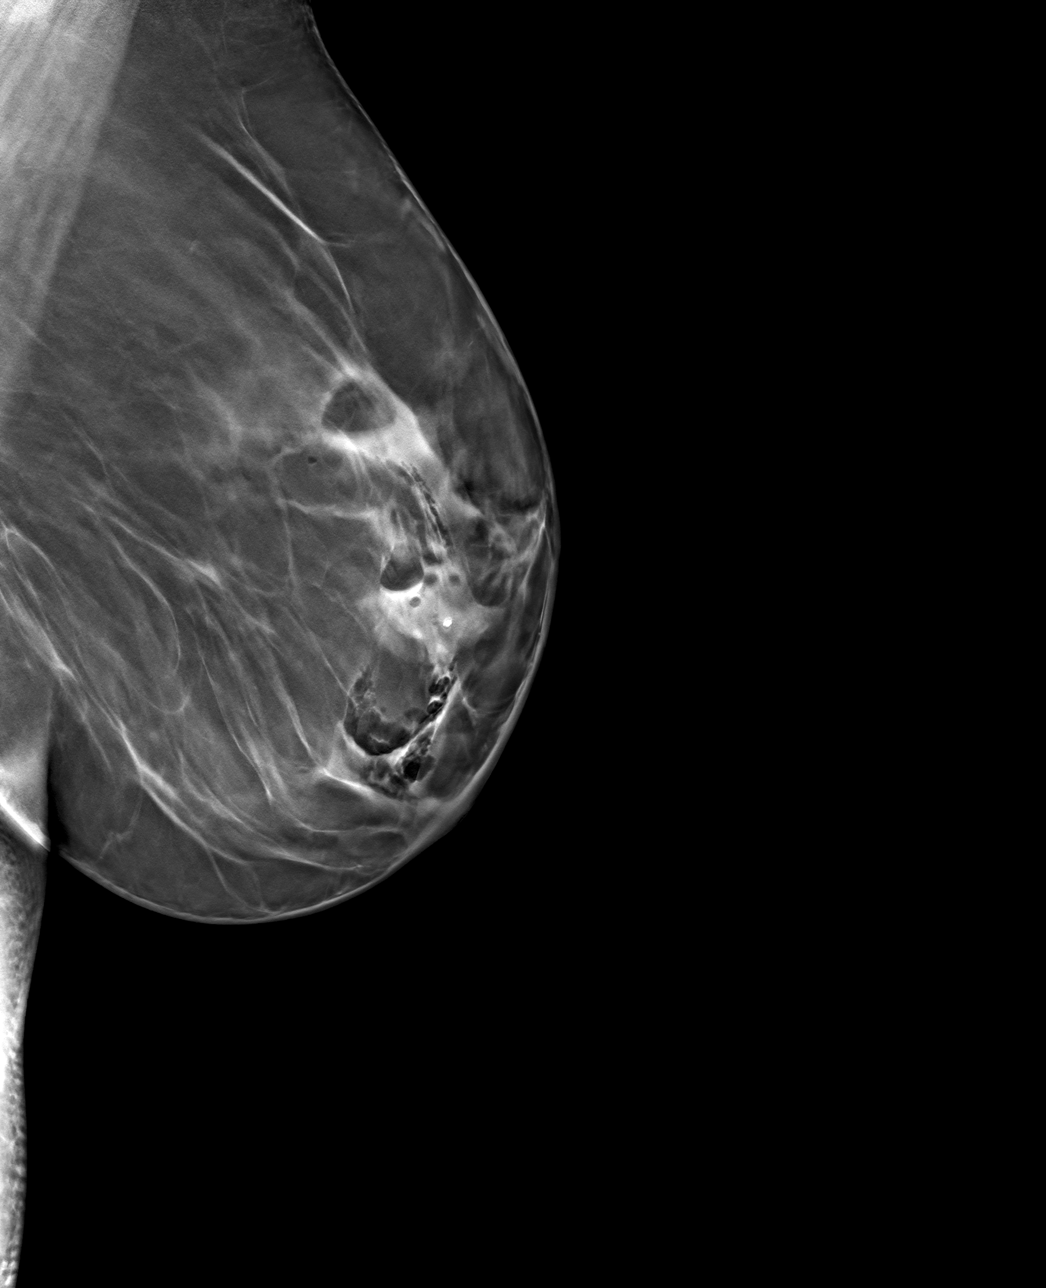

[4 of 12 positions shown; findings below may reference images not displayed]

FINDINGS: Mammographic images were obtained following MRI guided biopsy of the
anterior and posterior aspects of a 5 cm area of non mass
enhancement in the upper-outer quadrant of the left breast. The
biopsy marking clips are in expected positions at the sites of
biopsy.
IMPRESSION: Appropriate positioning of the cylinder shaped biopsy marking clip
at the site of biopsy in the midportion of the upper-outer quadrant
of the left breast and appropriate positioning of the
dumbbell-shaped biopsy marker clip in the anterior aspect of the
upper-outer quadrant of the left breast.

Final Assessment: Post Procedure Mammograms for Marker Placement

## 2019-09-14 IMAGING — MR MR BREAST BX W LOC DEV 1ST LESION IMAGE BX SPEC MR GUIDE*L*
1 series · 48 of 48 positions shown · IV contrast (gadavist)
Comparison: Previous exams.
COMPARISON: Previous exams.

Addendum:
CLINICAL DATA: 5 cm area of non mass enhancement in the upper-outer
quadrant of the left breast on a recent high risk screening MRI.

EXAM:
MRI GUIDED CORE NEEDLE BIOPSY OF THE LEFT BREAST X 2
TECHNIQUE: Multiplanar, multisequence MR imaging of the left breast was
performed both before and after administration of intravenous
contrast.
CONTRAST:  10mL GADAVIST GADOBUTROL 1 MMOL/ML IV SOLN

[Series 2: fiducial unilateral · sagittal · 2.0mm · 1.33mm/px · 48 of 52 slices shown]
[im 1/52]
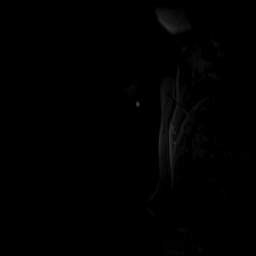
[im 2/52]
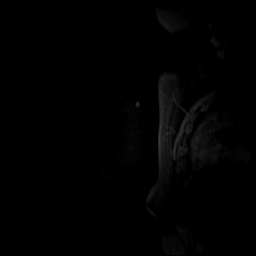
[im 3/52]
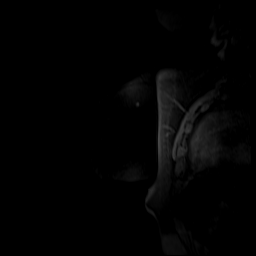
[im 4/52]
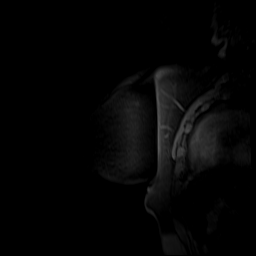
[im 5/52]
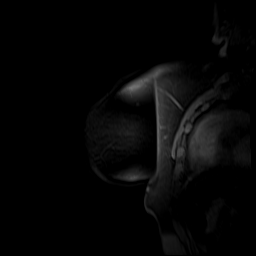
[im 6/52]
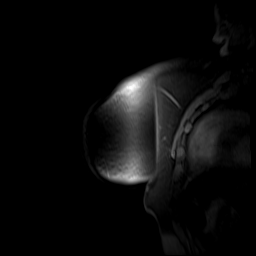
[im 7/52]
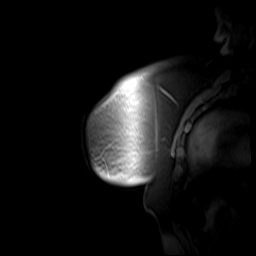
[im 8/52]
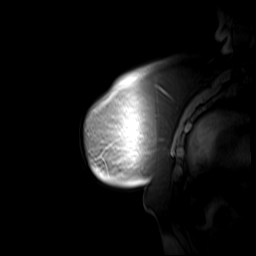
[im 9/52]
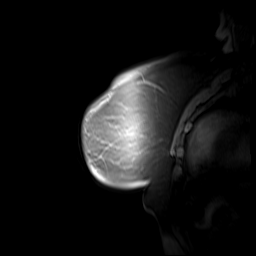
[im 10/52]
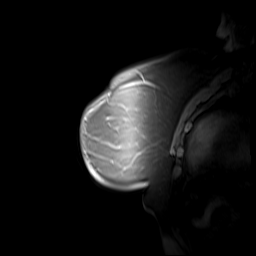
[im 11/52]
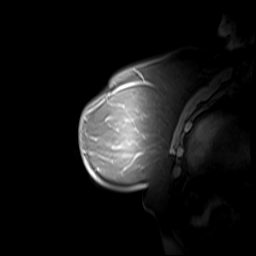
[im 12/52]
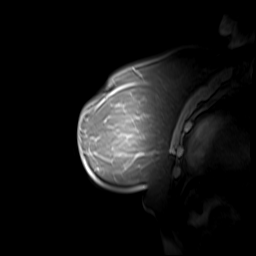
[im 14/52]
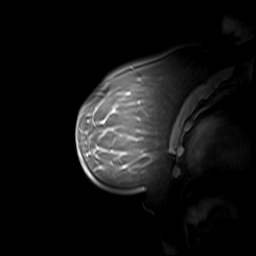
[im 15/52]
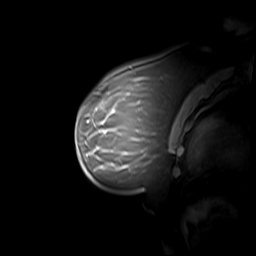
[im 16/52]
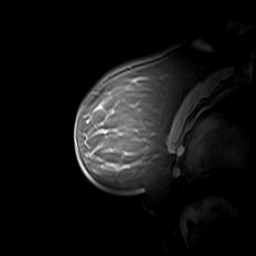
[im 17/52]
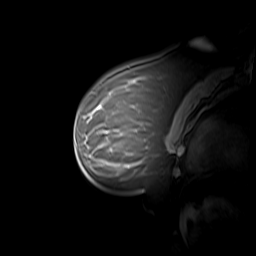
[im 18/52]
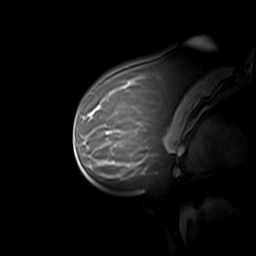
[im 19/52]
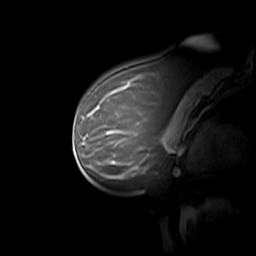
[im 20/52]
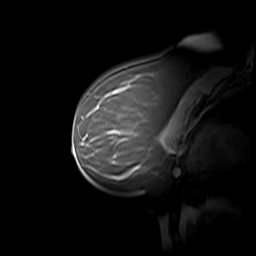
[im 21/52]
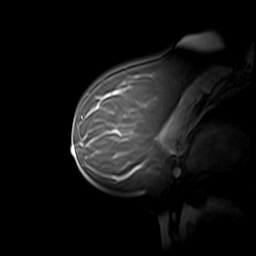
[im 22/52]
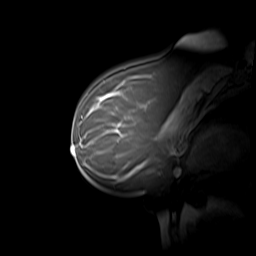
[im 23/52]
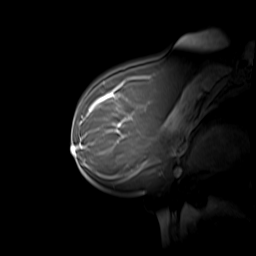
[im 24/52]
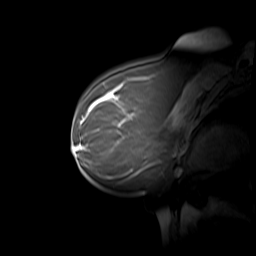
[im 25/52]
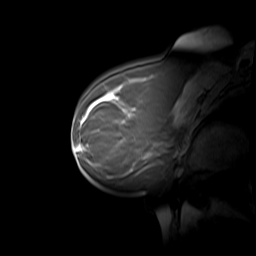
[im 27/52]
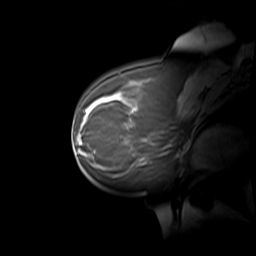
[im 28/52]
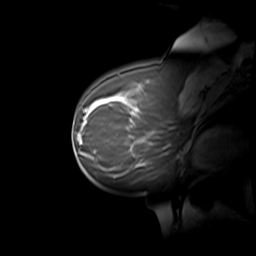
[im 29/52]
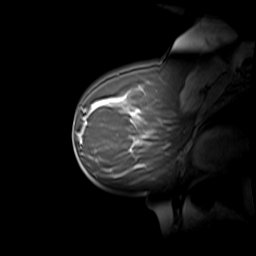
[im 30/52]
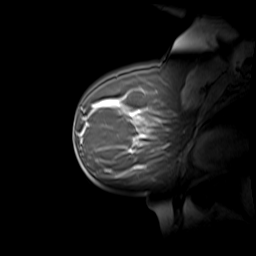
[im 31/52]
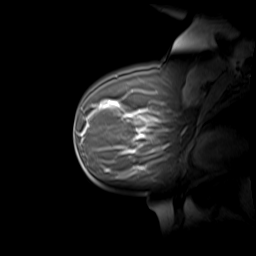
[im 32/52]
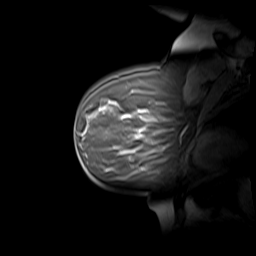
[im 33/52]
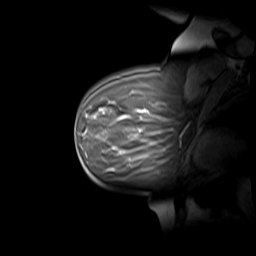
[im 34/52]
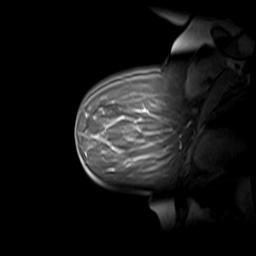
[im 35/52]
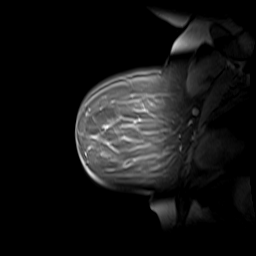
[im 36/52]
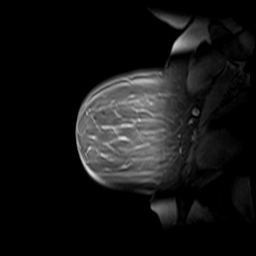
[im 37/52]
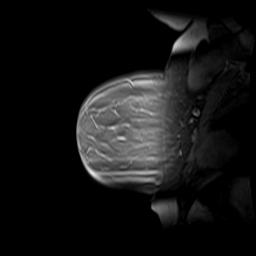
[im 38/52]
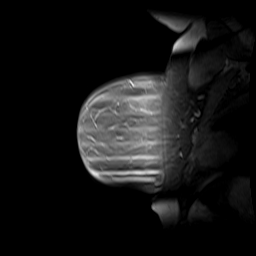
[im 40/52]
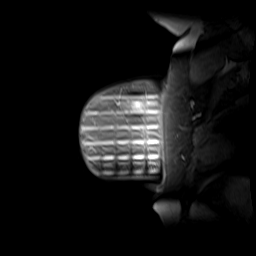
[im 41/52]
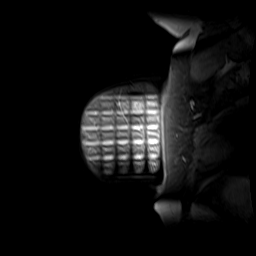
[im 42/52]
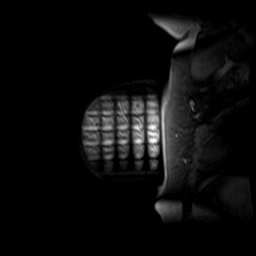
[im 43/52]
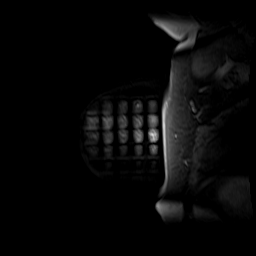
[im 44/52]
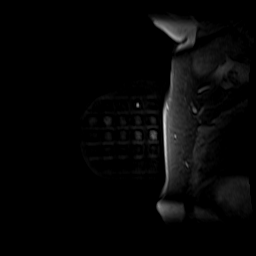
[im 45/52]
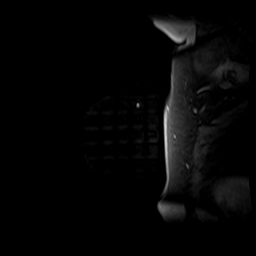
[im 46/52]
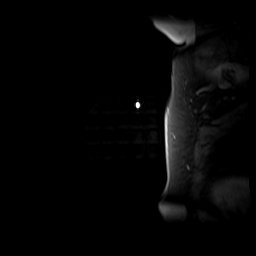
[im 47/52]
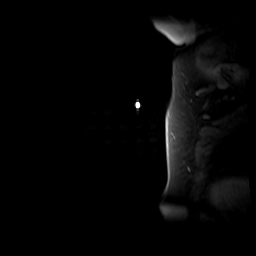
[im 48/52]
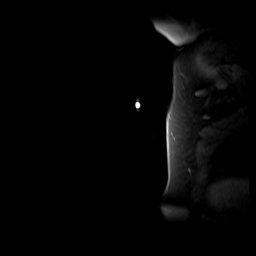
[im 49/52]
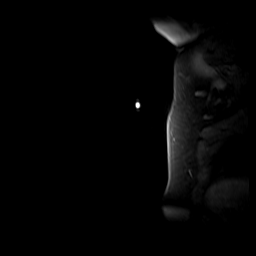
[im 50/52]
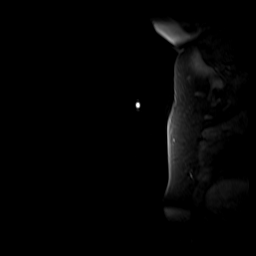
[im 52/52]
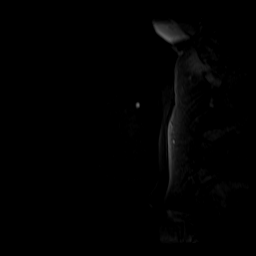

[48 of 48 positions shown; findings below may reference images not displayed]

FINDINGS: I met with the patient, and we discussed the procedure of MRI guided
biopsy, including risks, benefits, and alternatives. Specifically,
we discussed the risks of infection, bleeding, tissue injury, clip
migration, and inadequate sampling. Informed, written consent was
given. The usual time out protocol was performed immediately prior
to the procedures.

SITE #1: ANTERIOR ASPECT 5 CM NME UOQ LEFT BREAST (ANTERIOR BREAST):

Using sterile technique, 1% Lidocaine, MRI guidance, and a 9 gauge
vacuum assisted device, biopsy was performed of the posterior aspect
of the recently demonstrated 5 cm area of non mass enhancement in
the upper-outer quadrant of the left breast (mid breast) using a
lateral approach. At the conclusion of the procedure, a cylinder
shaped tissue marker clip was deployed into the biopsy cavity.
Follow-up 2-view mammogram was performed and dictated separately.

SITE #2: POSTERIOR ASPECT 5 CM NME UOQ LEFT BREAST (MID BREAST):

Using sterile technique, 1% Lidocaine, MRI guidance, and a 9 gauge
vacuum assisted device, biopsy was performed of the anterior aspect
of the recently demonstrated 5 cm area of non mass enhancement in
the upper-outer quadrant of the left breast (anterior breast) using
a lateral approach. At the conclusion of the procedure, a dumbbell
shaped tissue marker clip was deployed into the biopsy cavity.
Follow-up 2-view mammogram was performed and dictated separately.
IMPRESSION: MRI guided biopsy of the anterior and posterior aspects of the
recently demonstrated 5 cm area of non mass enhancement in the
upper-outer quadrant of the left breast. No apparent complications.

ADDENDUM:
Pathology revealed COLUMNAR CELL AND FIBROCYSTIC CHANGES WITH
APOCRINE METAPLASIA, PSEUDOANGIOMATOUS STROMAL HYPERPLASIA,
CALCIFICATIONS of the LEFT breast, anterior aspect 5 cm NME upper
outer quadrant. This was found to be concordant by Dr. CHAPECO.

Pathology revealed COMPLEX SCLEROSING LESION WITH ATYPIA of the LEFT
breast, posterior aspect 5 cm NME upper outer quadrant (mid) The
complex sclerosing lesion has a papilloma and epithelial
proliferation with architectural atypia. This was found to be
concordant by Dr. CHAPECO, with surgical consultation for
consideration of excision recommended.

Pathology results were discussed with the patient by telephone. The
patient reported doing well after the biopsies with tenderness at
the sites. Post biopsy instructions and care were reviewed and
questions were answered. The patient was encouraged to call The

The patient is returning for the recommended 2 additional MR-guided
biopsies on the RIGHT side on [DATE]. Further recommendation will
be guided by these results.

Surgical consultation has been arranged with Dr. CHAPECO at
[REDACTED] on [DATE].

Pathology results reported by CHAPECO RN on [DATE].

*** End of Addendum ***
FINDINGS: I met with the patient, and we discussed the procedure of MRI guided
biopsy, including risks, benefits, and alternatives. Specifically,
we discussed the risks of infection, bleeding, tissue injury, clip
migration, and inadequate sampling. Informed, written consent was
given. The usual time out protocol was performed immediately prior
to the procedures.

SITE #1: ANTERIOR ASPECT 5 CM NME UOQ LEFT BREAST (ANTERIOR BREAST):

Using sterile technique, 1% Lidocaine, MRI guidance, and a 9 gauge
vacuum assisted device, biopsy was performed of the posterior aspect
of the recently demonstrated 5 cm area of non mass enhancement in
the upper-outer quadrant of the left breast (mid breast) using a
lateral approach. At the conclusion of the procedure, a cylinder
shaped tissue marker clip was deployed into the biopsy cavity.
Follow-up 2-view mammogram was performed and dictated separately.

SITE #2: POSTERIOR ASPECT 5 CM NME UOQ LEFT BREAST (MID BREAST):

Using sterile technique, 1% Lidocaine, MRI guidance, and a 9 gauge
vacuum assisted device, biopsy was performed of the anterior aspect
of the recently demonstrated 5 cm area of non mass enhancement in
the upper-outer quadrant of the left breast (anterior breast) using
a lateral approach. At the conclusion of the procedure, a dumbbell
shaped tissue marker clip was deployed into the biopsy cavity.
Follow-up 2-view mammogram was performed and dictated separately.
IMPRESSION: MRI guided biopsy of the anterior and posterior aspects of the
recently demonstrated 5 cm area of non mass enhancement in the
upper-outer quadrant of the left breast. No apparent complications.

## 2019-09-14 IMAGING — MR MR BREAST BX W LOC DEV EA ADD LESION IMAGE BX SPEC MR GUIDE*L*
7 of 10 series · 30 of 48 positions shown · IV contrast (10 ml gadavist)
Comparison: Previous exams.
COMPARISON: Previous exams.

Addendum:
CLINICAL DATA: 5 cm area of non mass enhancement in the upper-outer
quadrant of the left breast on a recent high risk screening MRI.

EXAM:
MRI GUIDED CORE NEEDLE BIOPSY OF THE LEFT BREAST X 2
TECHNIQUE: Multiplanar, multisequence MR imaging of the left breast was
performed both before and after administration of intravenous
contrast.
CONTRAST:  10mL GADAVIST GADOBUTROL 1 MMOL/ML IV SOLN

[Series 2: fiducial unilateral · sagittal · 2.0mm · 1.33mm/px · 3 of 52 slices shown]
[im 1/52]
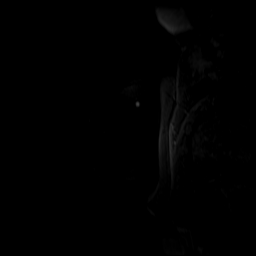
[im 26/52]
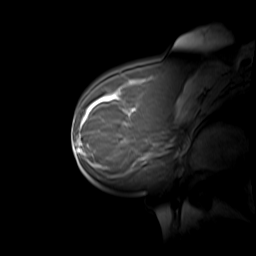
[im 52/52]
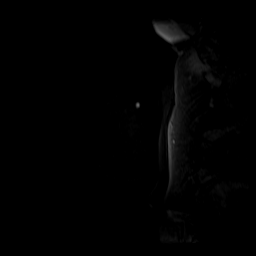

[Series 3: dynamic pre · axial · non-contrast · 1.3mm · 0.73mm/px · z∈[-43,+143]mm · 5 of 144 slices shown]
[im 1/144]
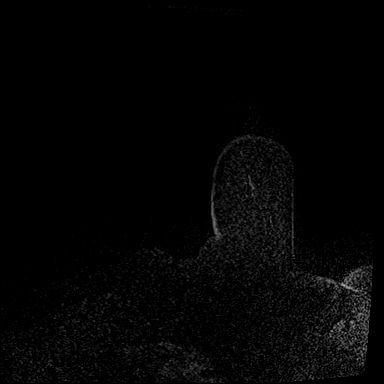
[im 36/144]
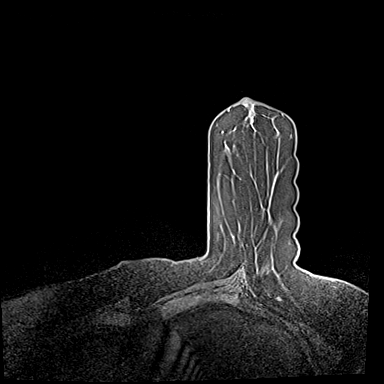
[im 72/144]
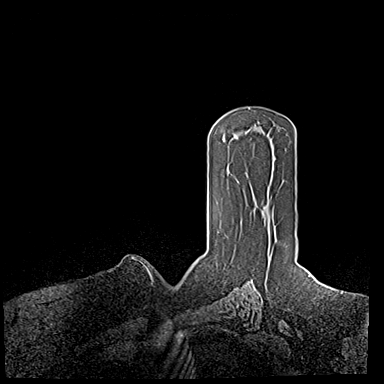
[im 108/144]
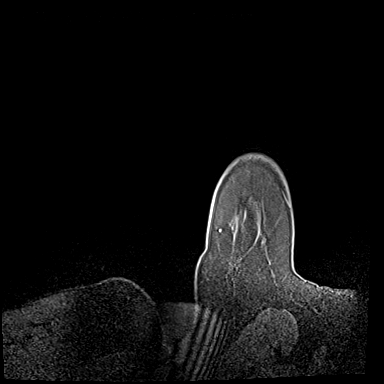
[im 144/144]
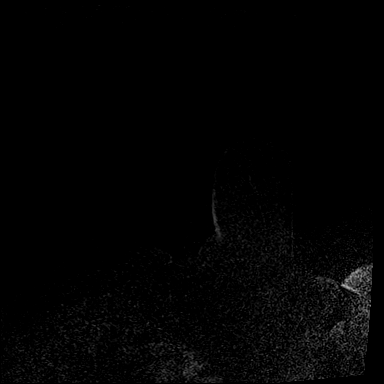

[Series 4: dynamic post 20 · axial · 1.3mm · 0.73mm/px · z∈[-43,+143]mm · 5 of 144 slices shown (1 of 2)]
[im 1/144]
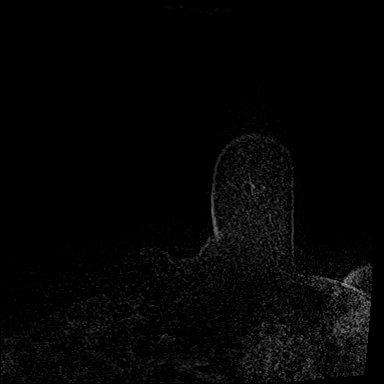
[im 36/144]
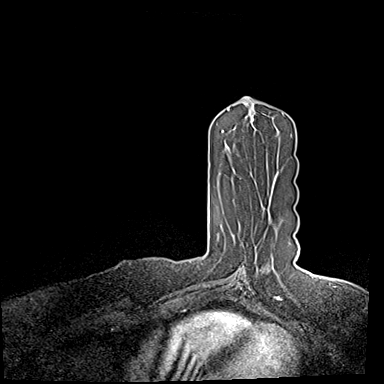
[im 72/144]
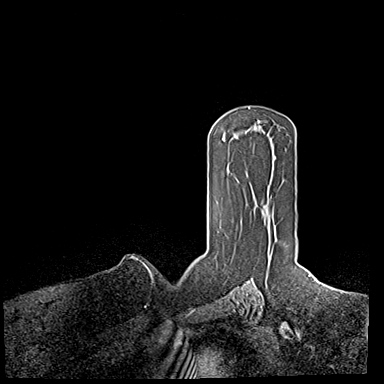
[im 108/144]
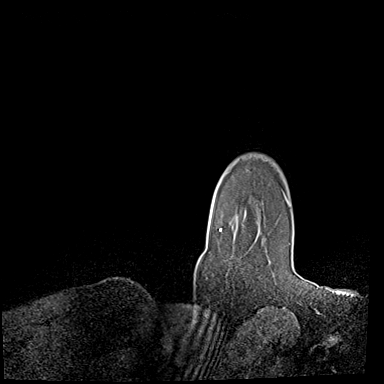
[im 144/144]
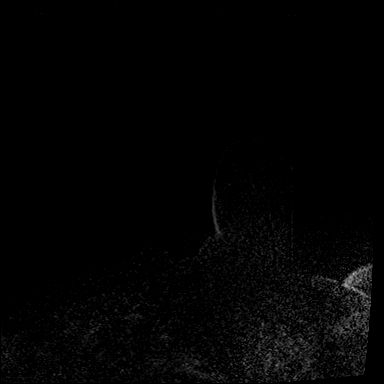

[Series 5: dynamic post 20 · axial · 1.3mm · 0.73mm/px · z∈[-43,+143]mm · 5 of 144 slices shown (2 of 2)]
[im 1/144]
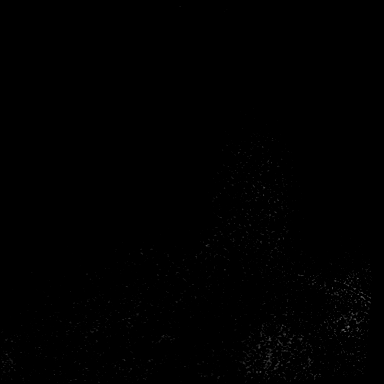
[im 36/144]
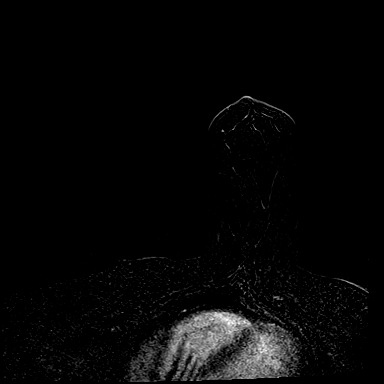
[im 72/144]
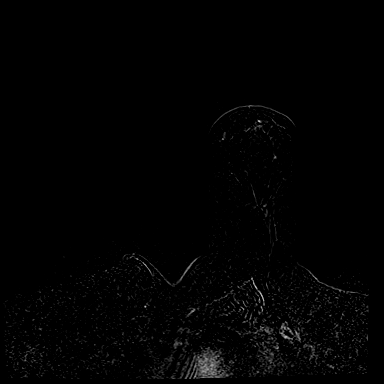
[im 108/144]
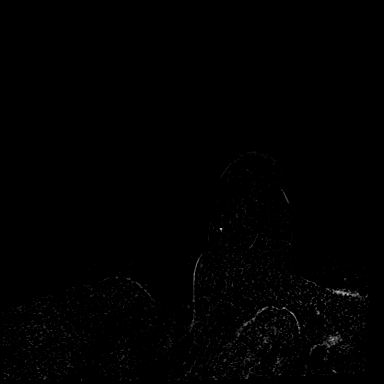
[im 144/144]
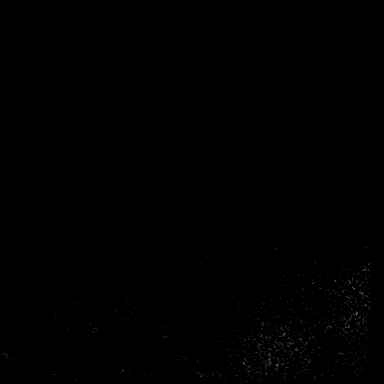

[Series 6: dynamic post 3 · axial · 1.3mm · 0.73mm/px · z∈[-43,+143]mm · 5 of 144 slices shown (1 of 2)]
[im 1/144]
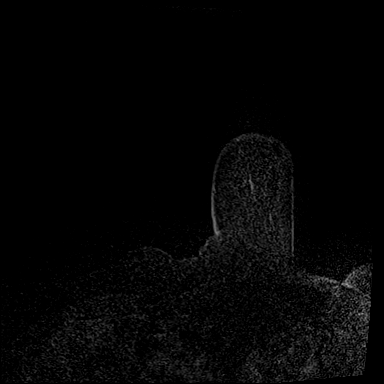
[im 36/144]
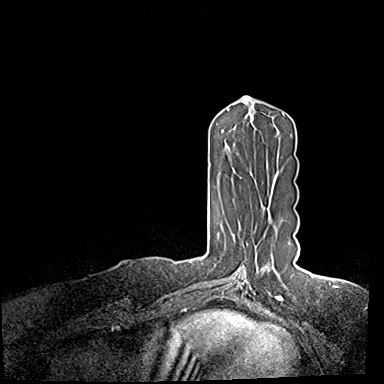
[im 72/144]
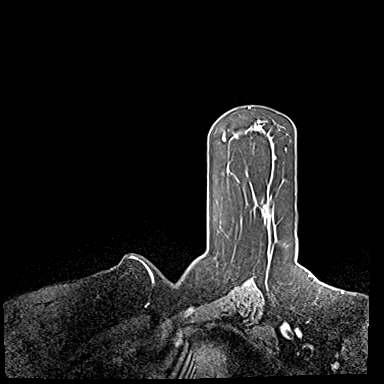
[im 108/144]
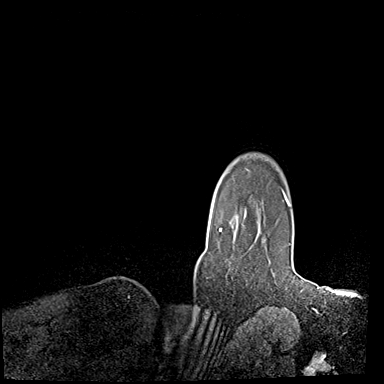
[im 144/144]
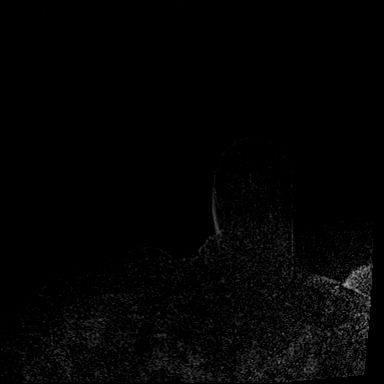

[Series 7: dynamic post 3 · axial · 1.3mm · 0.73mm/px · z∈[-43,+143]mm · 5 of 144 slices shown (2 of 2)]
[im 1/144]
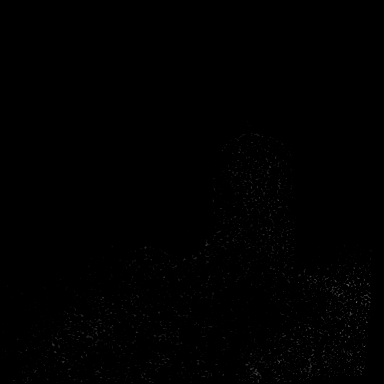
[im 36/144]
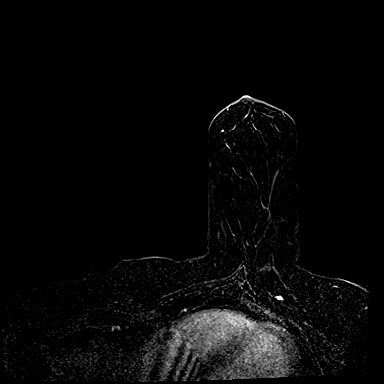
[im 72/144]
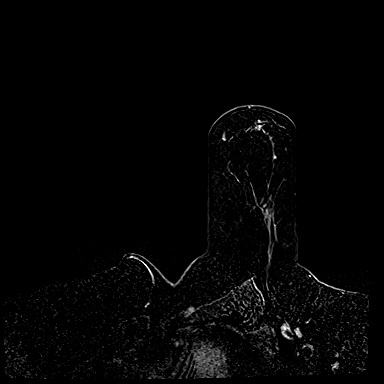
[im 108/144]
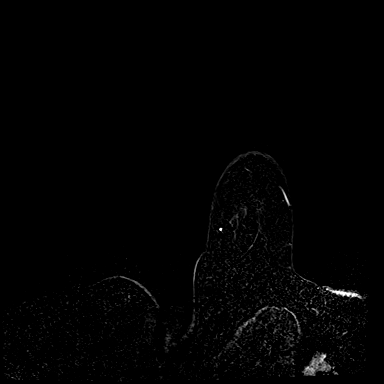
[im 144/144]
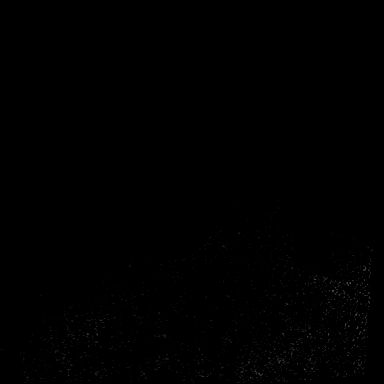

[Series 8: needle confirmation · axial · 1.3mm · 0.73mm/px · z∈[-43,+2]mm · 2 of 144 slices shown]
[im 1/144]
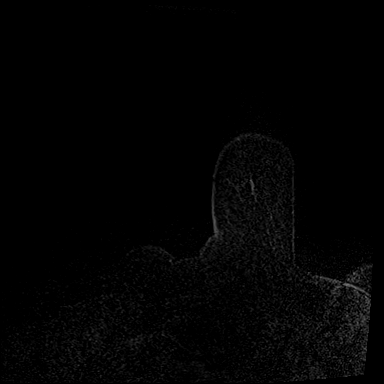
[im 36/144]
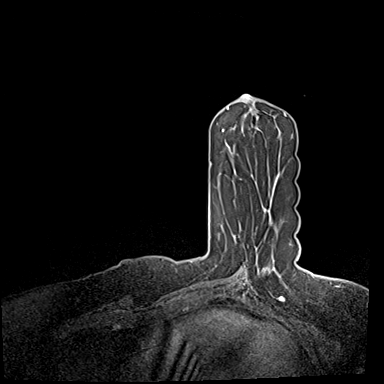

[30 of 48 positions shown; findings below may reference images not displayed]

FINDINGS: I met with the patient, and we discussed the procedure of MRI guided
biopsy, including risks, benefits, and alternatives. Specifically,
we discussed the risks of infection, bleeding, tissue injury, clip
migration, and inadequate sampling. Informed, written consent was
given. The usual time out protocol was performed immediately prior
to the procedures.

SITE #1: ANTERIOR ASPECT 5 CM NME UOQ LEFT BREAST (ANTERIOR BREAST):

Using sterile technique, 1% Lidocaine, MRI guidance, and a 9 gauge
vacuum assisted device, biopsy was performed of the posterior aspect
of the recently demonstrated 5 cm area of non mass enhancement in
the upper-outer quadrant of the left breast (mid breast) using a
lateral approach. At the conclusion of the procedure, a cylinder
shaped tissue marker clip was deployed into the biopsy cavity.
Follow-up 2-view mammogram was performed and dictated separately.

SITE #2: POSTERIOR ASPECT 5 CM NME UOQ LEFT BREAST (MID BREAST):

Using sterile technique, 1% Lidocaine, MRI guidance, and a 9 gauge
vacuum assisted device, biopsy was performed of the anterior aspect
of the recently demonstrated 5 cm area of non mass enhancement in
the upper-outer quadrant of the left breast (anterior breast) using
a lateral approach. At the conclusion of the procedure, a dumbbell
shaped tissue marker clip was deployed into the biopsy cavity.
Follow-up 2-view mammogram was performed and dictated separately.
IMPRESSION: MRI guided biopsy of the anterior and posterior aspects of the
recently demonstrated 5 cm area of non mass enhancement in the
upper-outer quadrant of the left breast. No apparent complications.

ADDENDUM:
Pathology revealed COLUMNAR CELL AND FIBROCYSTIC CHANGES WITH
APOCRINE METAPLASIA, PSEUDOANGIOMATOUS STROMAL HYPERPLASIA,
CALCIFICATIONS of the LEFT breast, anterior aspect 5 cm NME upper
outer quadrant. This was found to be concordant by Dr. CHAPECO.

Pathology revealed COMPLEX SCLEROSING LESION WITH ATYPIA of the LEFT
breast, posterior aspect 5 cm NME upper outer quadrant (mid) The
complex sclerosing lesion has a papilloma and epithelial
proliferation with architectural atypia. This was found to be
concordant by Dr. CHAPECO, with surgical consultation for
consideration of excision recommended.

Pathology results were discussed with the patient by telephone. The
patient reported doing well after the biopsies with tenderness at
the sites. Post biopsy instructions and care were reviewed and
questions were answered. The patient was encouraged to call The

The patient is returning for the recommended 2 additional MR-guided
biopsies on the RIGHT side on [DATE]. Further recommendation will
be guided by these results.

Surgical consultation has been arranged with Dr. CHAPECO at
[REDACTED] on [DATE].

Pathology results reported by CHAPECO RN on [DATE].

*** End of Addendum ***
FINDINGS: I met with the patient, and we discussed the procedure of MRI guided
biopsy, including risks, benefits, and alternatives. Specifically,
we discussed the risks of infection, bleeding, tissue injury, clip
migration, and inadequate sampling. Informed, written consent was
given. The usual time out protocol was performed immediately prior
to the procedures.

SITE #1: ANTERIOR ASPECT 5 CM NME UOQ LEFT BREAST (ANTERIOR BREAST):

Using sterile technique, 1% Lidocaine, MRI guidance, and a 9 gauge
vacuum assisted device, biopsy was performed of the posterior aspect
of the recently demonstrated 5 cm area of non mass enhancement in
the upper-outer quadrant of the left breast (mid breast) using a
lateral approach. At the conclusion of the procedure, a cylinder
shaped tissue marker clip was deployed into the biopsy cavity.
Follow-up 2-view mammogram was performed and dictated separately.

SITE #2: POSTERIOR ASPECT 5 CM NME UOQ LEFT BREAST (MID BREAST):

Using sterile technique, 1% Lidocaine, MRI guidance, and a 9 gauge
vacuum assisted device, biopsy was performed of the anterior aspect
of the recently demonstrated 5 cm area of non mass enhancement in
the upper-outer quadrant of the left breast (anterior breast) using
a lateral approach. At the conclusion of the procedure, a dumbbell
shaped tissue marker clip was deployed into the biopsy cavity.
Follow-up 2-view mammogram was performed and dictated separately.
IMPRESSION: MRI guided biopsy of the anterior and posterior aspects of the
recently demonstrated 5 cm area of non mass enhancement in the
upper-outer quadrant of the left breast. No apparent complications.

## 2019-09-14 MED ORDER — GADOBUTROL 1 MMOL/ML IV SOLN
10.0000 mL | Freq: Once | INTRAVENOUS | Status: AC | PRN
  Administered 2019-09-14: 10 mL via INTRAVENOUS

## 2019-09-18 ENCOUNTER — Ambulatory Visit
Admission: RE | Admit: 2019-09-18 | Discharge: 2019-09-18 | Disposition: A | Discharge status: Home / Self Care | Payer: Federal, State, Local not specified - PPO | Source: Ambulatory Visit | Attending: Obstetrics and Gynecology | Admitting: Obstetrics and Gynecology

## 2019-09-18 ENCOUNTER — Other Ambulatory Visit: Payer: Self-pay

## 2019-09-18 ENCOUNTER — Other Ambulatory Visit: Payer: Self-pay | Admitting: Radiology

## 2019-09-18 DIAGNOSIS — N6489 Other specified disorders of breast: Secondary | ICD-10-CM | POA: Diagnosis not present

## 2019-09-18 DIAGNOSIS — N6311 Unspecified lump in the right breast, upper outer quadrant: Secondary | ICD-10-CM | POA: Diagnosis not present

## 2019-09-18 DIAGNOSIS — R928 Other abnormal and inconclusive findings on diagnostic imaging of breast: Secondary | ICD-10-CM

## 2019-09-18 DIAGNOSIS — N6310 Unspecified lump in the right breast, unspecified quadrant: Secondary | ICD-10-CM | POA: Diagnosis not present

## 2019-09-18 DIAGNOSIS — N6011 Diffuse cystic mastopathy of right breast: Secondary | ICD-10-CM | POA: Diagnosis not present

## 2019-09-18 IMAGING — MG MM BREAST LOCALIZATION CLIP
4 series · 4 of 12 positions shown · non-contrast
Comparison: Previous exam(s).

CLINICAL DATA: Evaluate biopsy markers

EXAM:
DIAGNOSTIC RIGHT MAMMOGRAM POST MRI BIOPSY

[R CC synth-2D]
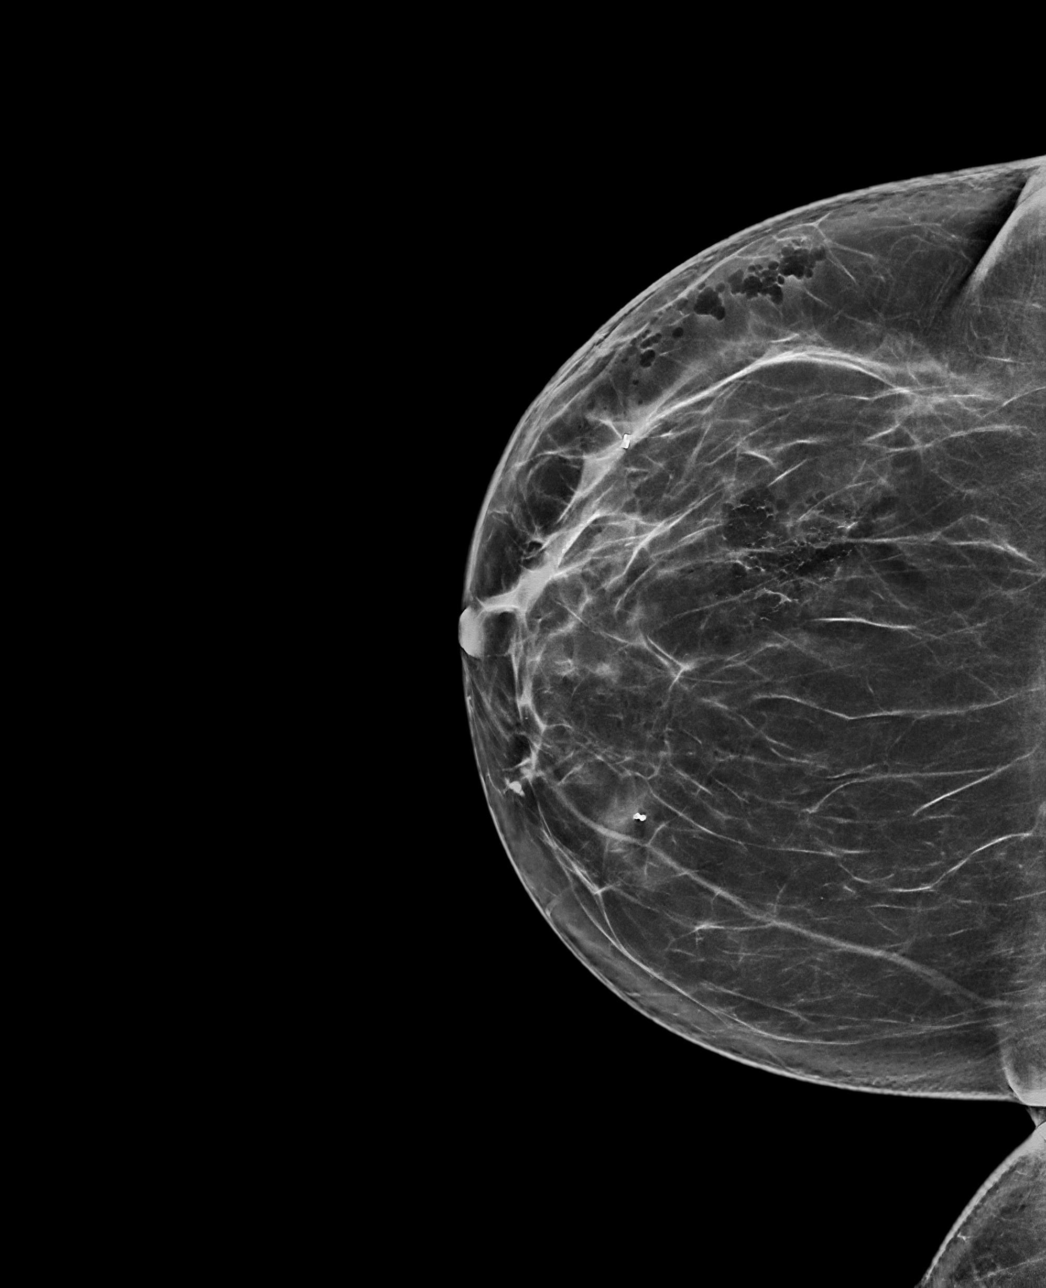

[R ML synth-2D]
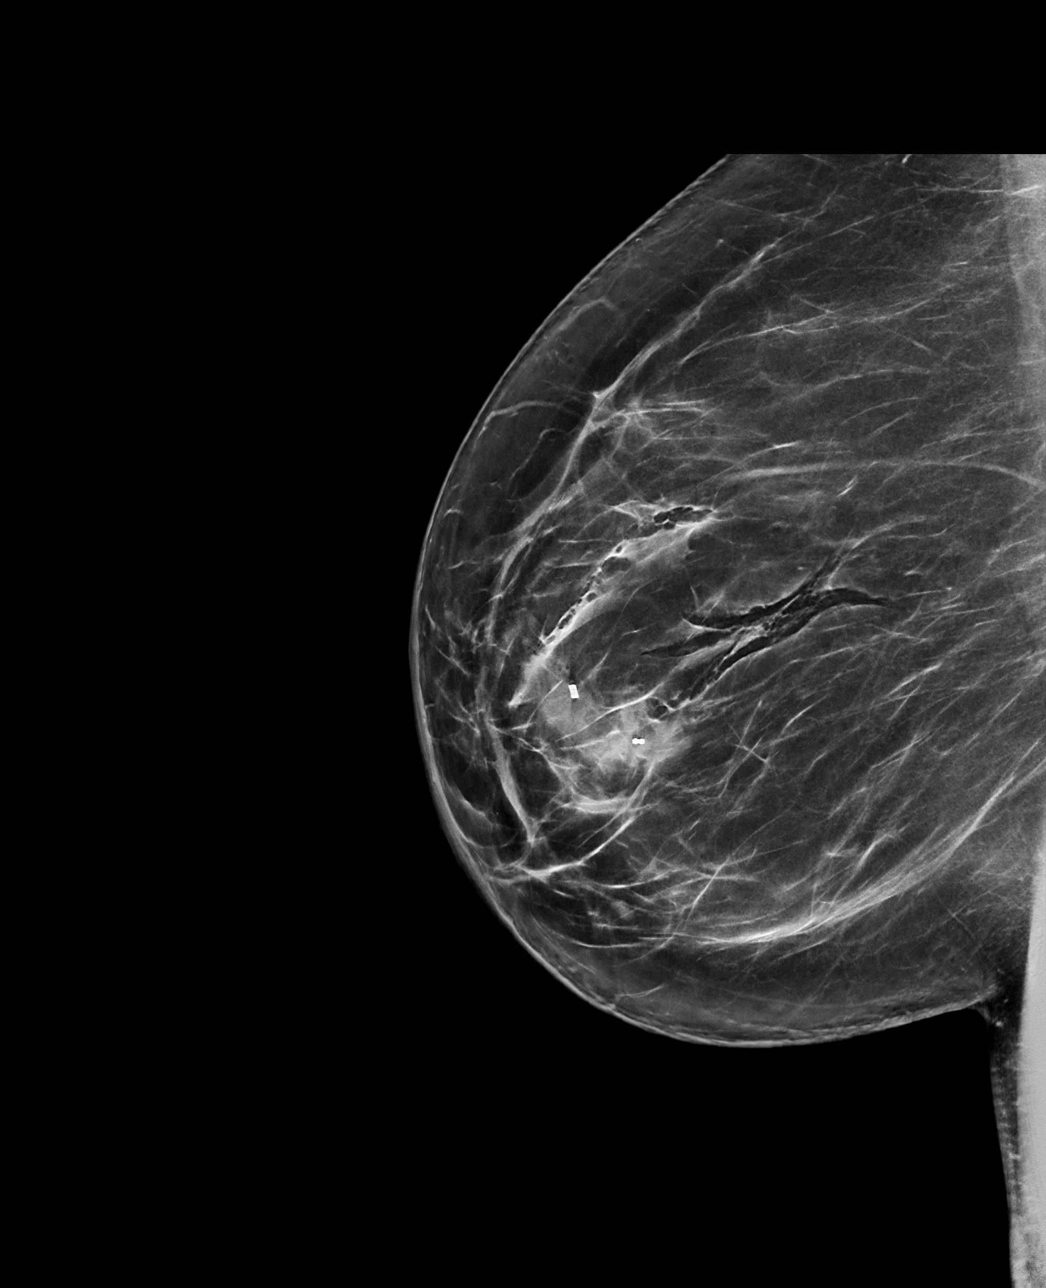

[R CC tomo · tomo slice 41/81.0]
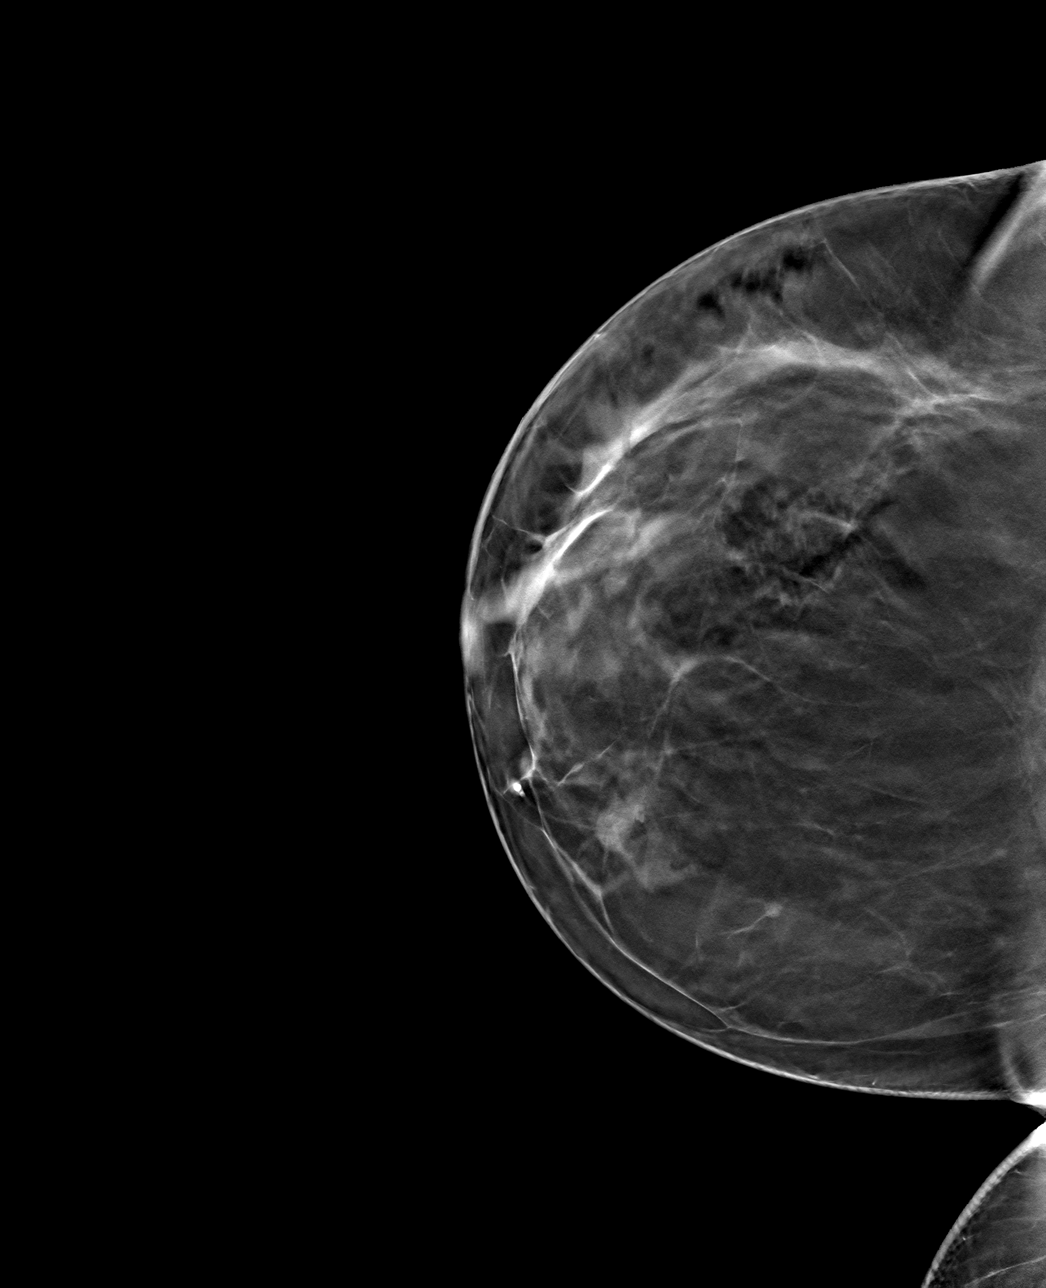

[R ML tomo · tomo slice 41/82.0]
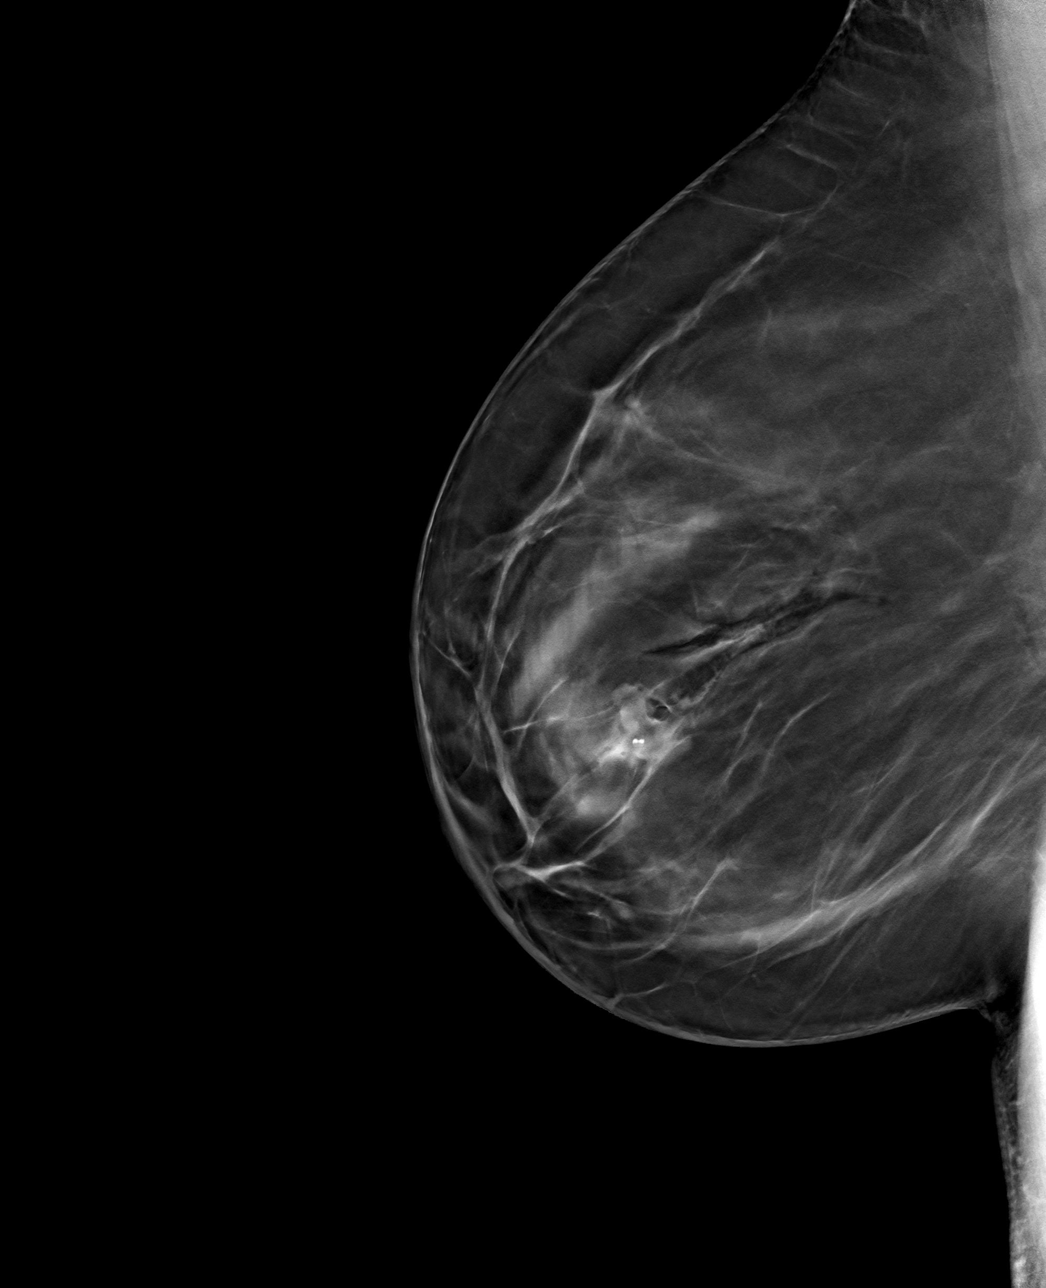

[4 of 12 positions shown; findings below may reference images not displayed]

FINDINGS: Mammographic images were obtained following MRI guided biopsy of 2
right breast masses. The cylinder shaped clip is at the site of the
biopsied upper-outer quadrant mass. The dumbbell shaped clip
migrated 3.8 cm medial to the biopsied superior mass.
IMPRESSION: Appropriate positioning of the cylinder shaped biopsy marking clip
at the site of biopsy in the upper outer right breast. The dumbbell
shaped clip migrated 3.8 cm medial to the biopsied superior mass.

Final Assessment: Post Procedure Mammograms for Marker Placement

## 2019-09-18 IMAGING — MR MR BREAST BX W/ LOC DEV 1ST LEASION IMAGE BX SPEC MR GUIDE*R*
5 of 6 series · 32 of 48 positions shown · IV contrast (10 ml Gadavist)
Comparison: Previous exams.
COMPARISON: Previous exams.

Addendum:
CLINICAL DATA: Biopsy of 2 masses in the right breast

EXAM:
MRI GUIDED CORE NEEDLE BIOPSY OF THE RIGHT BREAST
TECHNIQUE: Multiplanar, multisequence MR imaging of the right breast was
performed both before and after administration of intravenous
contrast.
CONTRAST:  10mL GADAVIST GADOBUTROL 1 MMOL/ML IV SOLN

[Series 2: fiducial unilateral · sagittal · 2.0mm · 1.33mm/px · 3 of 60 slices shown]
[im 1/60]
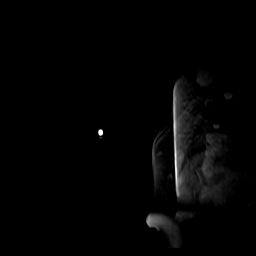
[im 30/60]
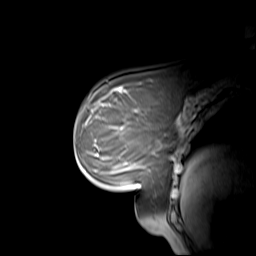
[im 60/60]
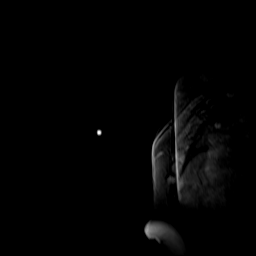

[Series 3: dynamic pre · axial · non-contrast · 1.3mm · 0.73mm/px · z∈[-78,+108]mm · 9 of 144 slices shown]
[im 1/144]
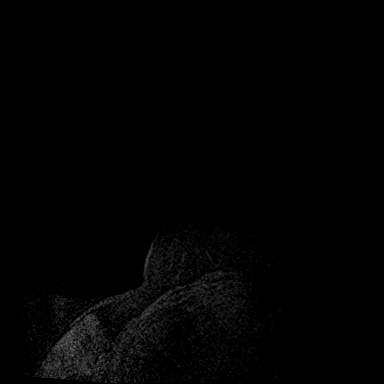
[im 18/144]
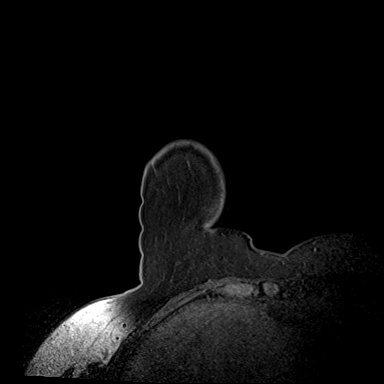
[im 36/144]
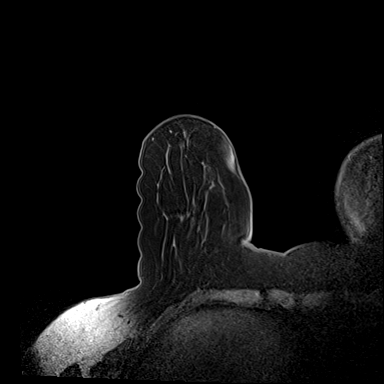
[im 54/144]
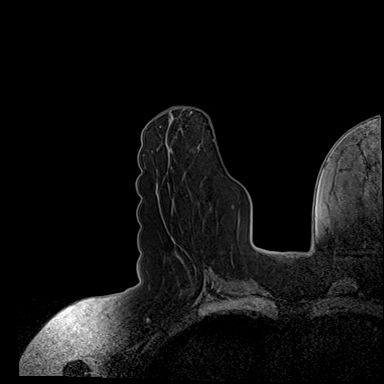
[im 72/144]
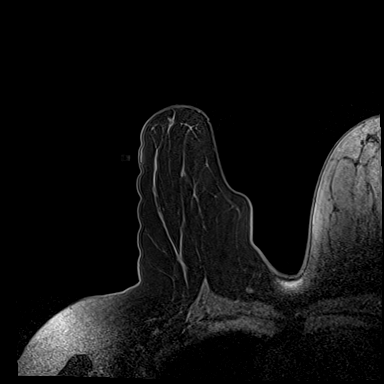
[im 90/144]
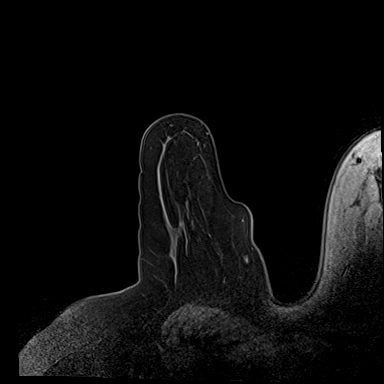
[im 108/144]
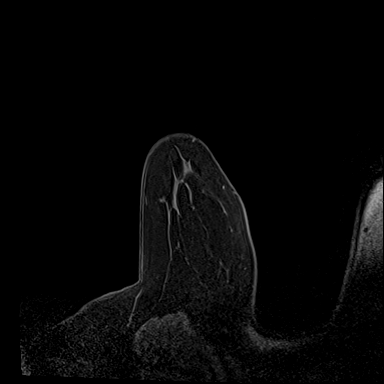
[im 126/144]
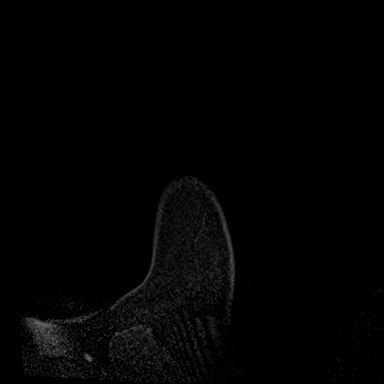
[im 144/144]
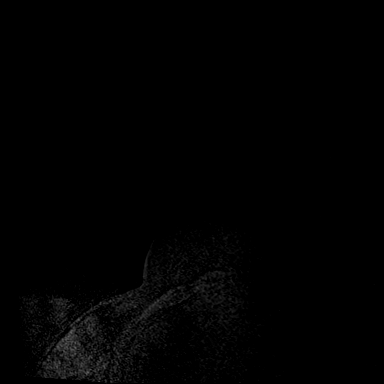

[Series 4: dynamic post 20 · axial · 1.3mm · 0.73mm/px · z∈[-78,+108]mm · 9 of 144 slices shown (1 of 2)]
[im 1/144]
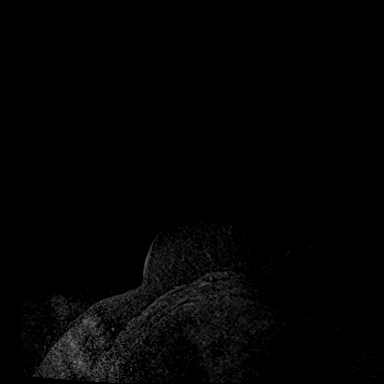
[im 18/144]
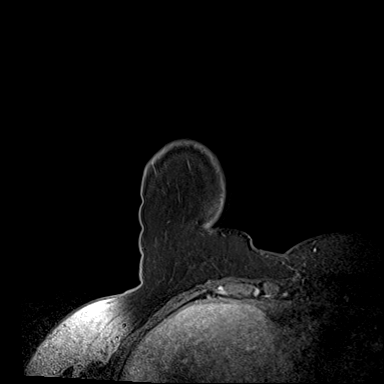
[im 36/144]
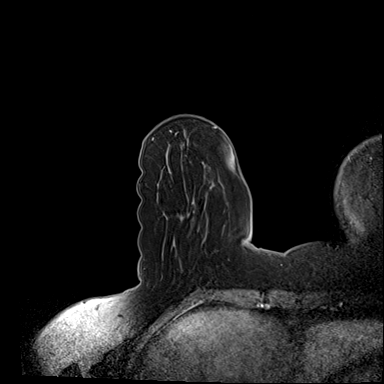
[im 54/144]
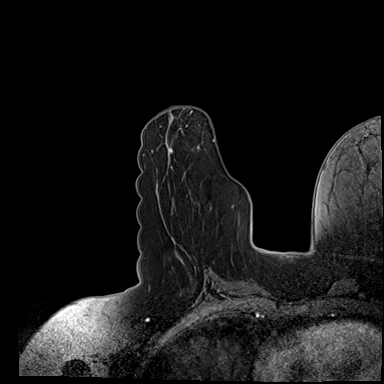
[im 72/144]
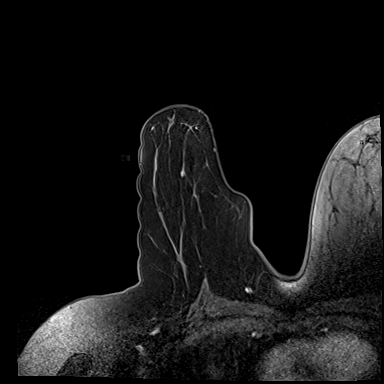
[im 90/144]
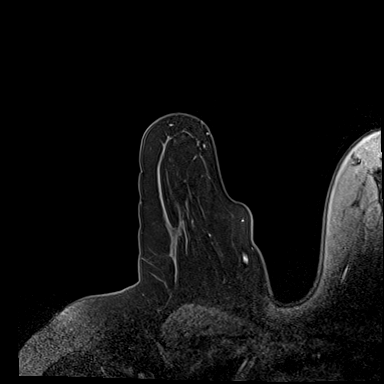
[im 108/144]
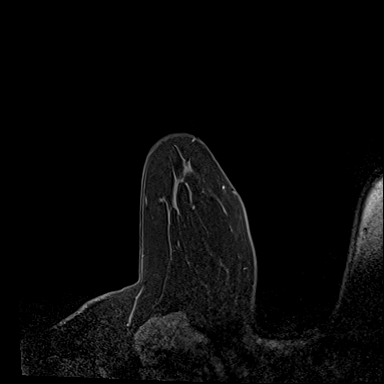
[im 126/144]
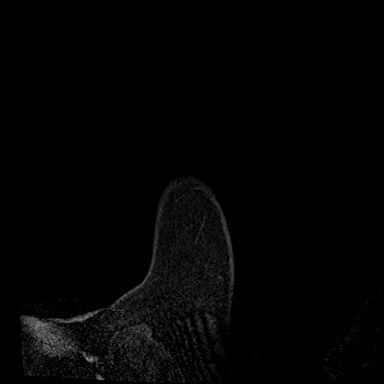
[im 144/144]
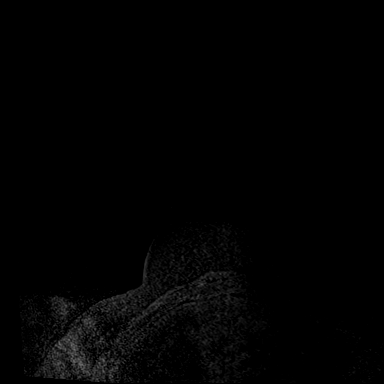

[Series 5: dynamic post 20 · axial · 1.3mm · 0.73mm/px · z∈[-78,+108]mm · 9 of 144 slices shown (2 of 2)]
[im 1/144]
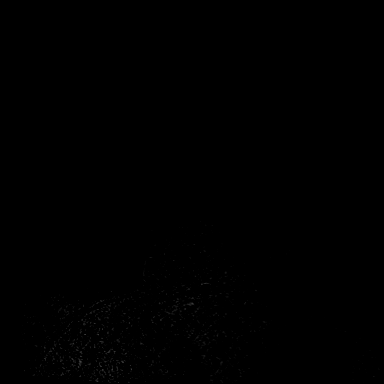
[im 18/144]
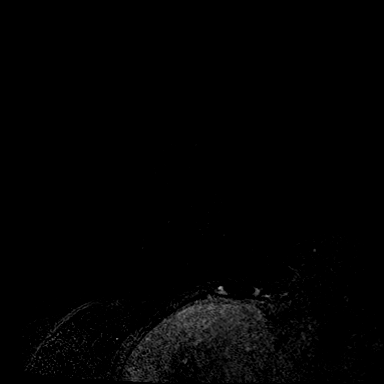
[im 36/144]
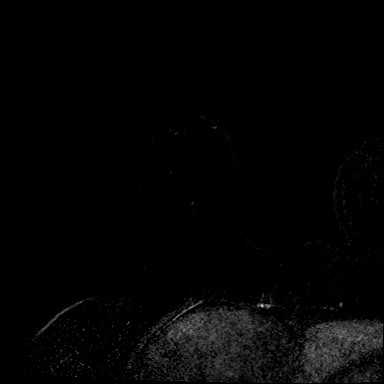
[im 54/144]
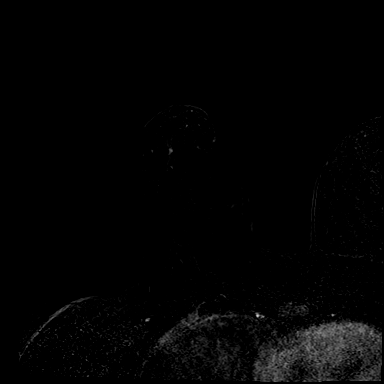
[im 72/144]
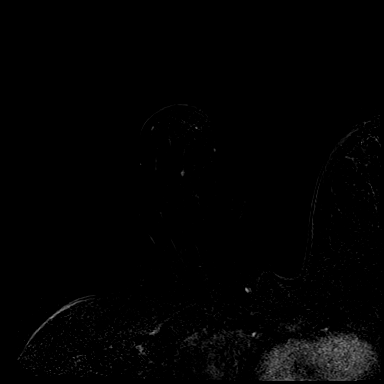
[im 90/144]
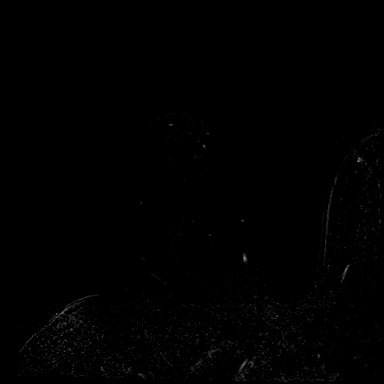
[im 108/144]
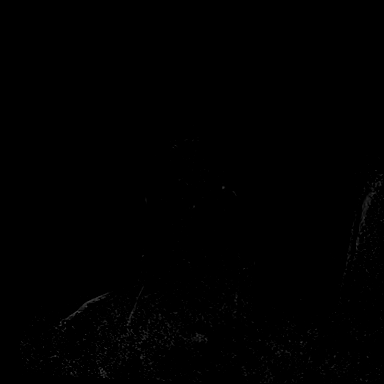
[im 126/144]
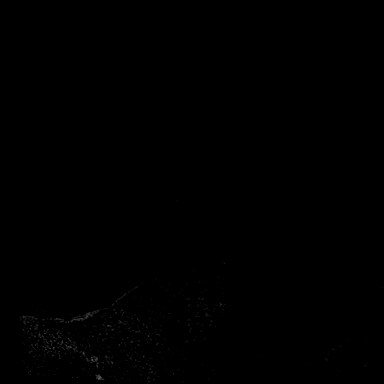
[im 144/144]
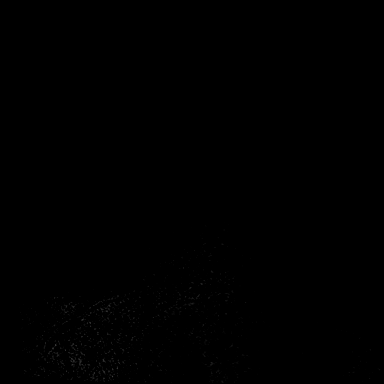

[Series 6: needle confirmation · axial · 1.3mm · 0.73mm/px · z∈[-78,-56]mm · 2 of 144 slices shown]
[im 1/144]
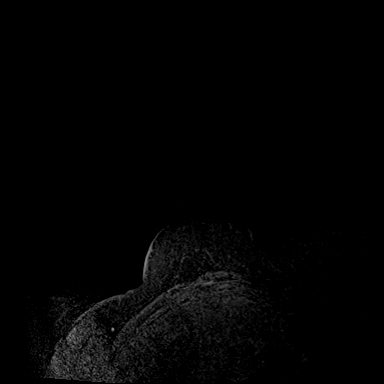
[im 18/144]
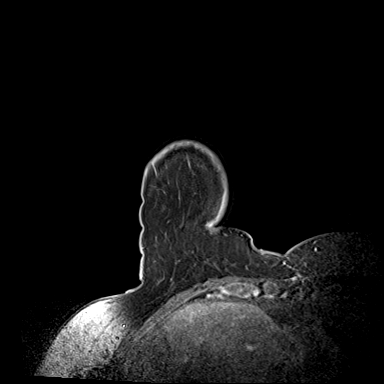

[32 of 48 positions shown; findings below may reference images not displayed]

FINDINGS: I met with the patient, and we discussed the procedure of MRI guided
biopsy, including risks, benefits, and alternatives. Specifically,
we discussed the risks of infection, bleeding, tissue injury, clip
migration, and inadequate sampling. Informed, written consent was
given. The usual time out protocol was performed immediately prior
to the procedure.

Using sterile technique, 1% Lidocaine, MRI guidance, and a 9 gauge
vacuum assisted device, biopsy was performed of the superior mass in
the right breast using a lateral approach. At the conclusion of the
procedure, a dumbbell shaped tissue marker clip was deployed into
the biopsy cavity. Follow-up 2-view mammogram was performed and
dictated separately.

Using sterile technique, 1% Lidocaine, MRI guidance, and a 9 gauge
vacuum assisted device, biopsy was performed of the upper-outer
quadrant right breast mass using a lateral approach. At the
conclusion of the procedure, a cylinder shaped tissue marker clip
was deployed into the biopsy cavity. Follow-up 2-view mammogram was
performed and dictated separately.
IMPRESSION: MRI guided biopsy of 2 right breast masses. No apparent
complications.

ADDENDUM:
Pathology revealed FIBROCYSTIC CHANGE. DUCT EPITHELIAL HYPERPLASIA.
PSEUDOANGIOMATOUS STROMAL HYPERPLASIA (PASH) of the RIGHT breast,
superior. This was found to be concordant by Dr. STRIBLING.

Pathology revealed COMPLEX SCLEROSING LESION of the RIGHT breast,
upper outer. This was found to be concordant by Dr. STRIBLING,
with excision recommended.

Pathology results were discussed with the patient by telephone. The
patient reported doing well after the biopsies with tenderness at
the sites. Post biopsy instructions and care were reviewed and
questions were answered. The patient was encouraged to call The

Surgical consultation has been arranged with Dr. STRIBLING at
[REDACTED] on [DATE]. Bilateral breast MRI
recommended in 6 months per protocol to follow LEFT and RIGHT
non-surgical sites.

Pathology results reported by STRIBLING RN on [DATE].

*** End of Addendum ***
FINDINGS: I met with the patient, and we discussed the procedure of MRI guided
biopsy, including risks, benefits, and alternatives. Specifically,
we discussed the risks of infection, bleeding, tissue injury, clip
migration, and inadequate sampling. Informed, written consent was
given. The usual time out protocol was performed immediately prior
to the procedure.

Using sterile technique, 1% Lidocaine, MRI guidance, and a 9 gauge
vacuum assisted device, biopsy was performed of the superior mass in
the right breast using a lateral approach. At the conclusion of the
procedure, a dumbbell shaped tissue marker clip was deployed into
the biopsy cavity. Follow-up 2-view mammogram was performed and
dictated separately.

Using sterile technique, 1% Lidocaine, MRI guidance, and a 9 gauge
vacuum assisted device, biopsy was performed of the upper-outer
quadrant right breast mass using a lateral approach. At the
conclusion of the procedure, a cylinder shaped tissue marker clip
was deployed into the biopsy cavity. Follow-up 2-view mammogram was
performed and dictated separately.
IMPRESSION: MRI guided biopsy of 2 right breast masses. No apparent
complications.

## 2019-09-18 IMAGING — MR MR BREAST BX W/ LOC DEV EA ADD LESION IMAGE BX SPEC MR GUIDE*R*
5 of 6 series · 32 of 48 positions shown · IV contrast (10 ml Gadavist)
Comparison: Previous exams.
COMPARISON: Previous exams.

Addendum:
CLINICAL DATA: Biopsy of 2 masses in the right breast

EXAM:
MRI GUIDED CORE NEEDLE BIOPSY OF THE RIGHT BREAST
TECHNIQUE: Multiplanar, multisequence MR imaging of the right breast was
performed both before and after administration of intravenous
contrast.
CONTRAST:  10mL GADAVIST GADOBUTROL 1 MMOL/ML IV SOLN

[Series 2: fiducial unilateral · sagittal · 2.0mm · 1.33mm/px · 3 of 60 slices shown]
[im 1/60]
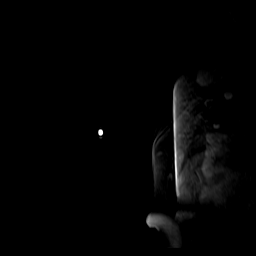
[im 30/60]
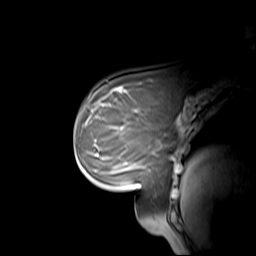
[im 60/60]
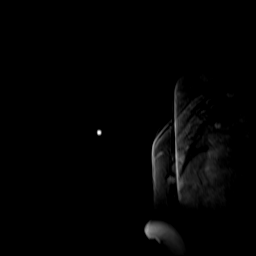

[Series 3: dynamic pre · axial · non-contrast · 1.3mm · 0.73mm/px · z∈[-78,+108]mm · 9 of 144 slices shown]
[im 1/144]
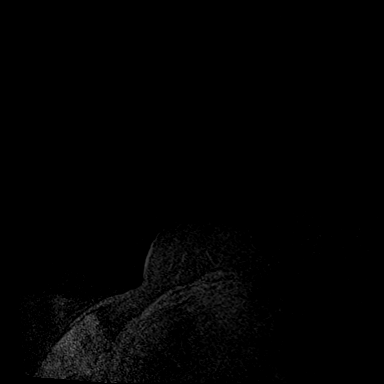
[im 18/144]
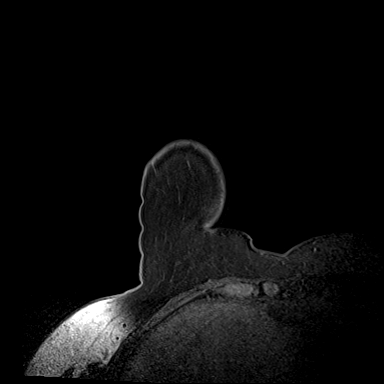
[im 36/144]
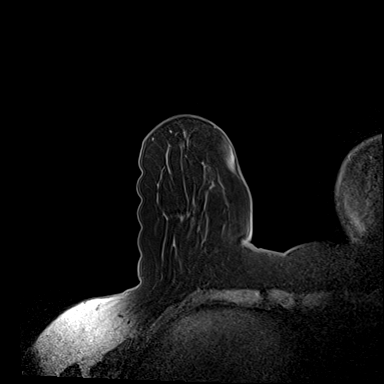
[im 54/144]
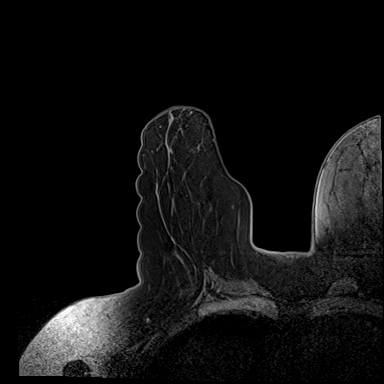
[im 72/144]
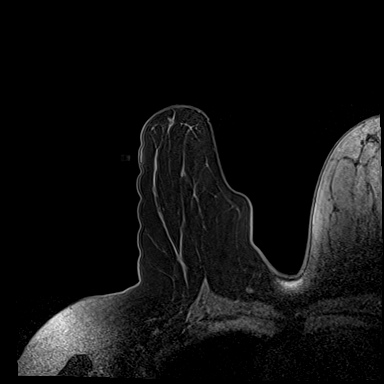
[im 90/144]
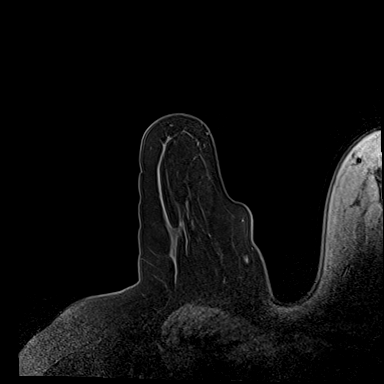
[im 108/144]
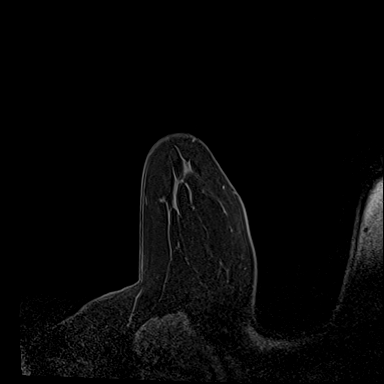
[im 126/144]
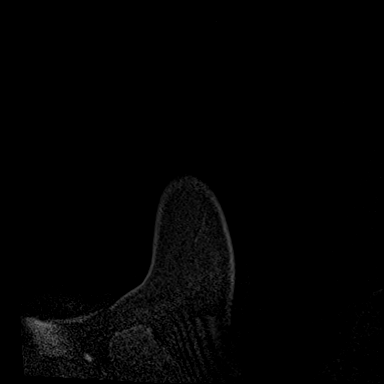
[im 144/144]
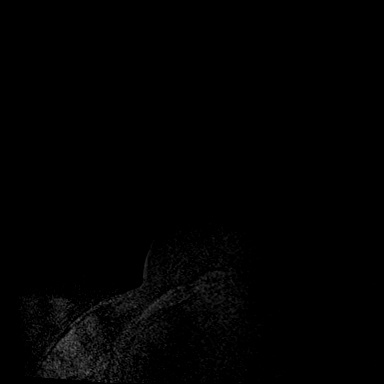

[Series 4: dynamic post 20 · axial · 1.3mm · 0.73mm/px · z∈[-78,+108]mm · 9 of 144 slices shown (1 of 2)]
[im 1/144]
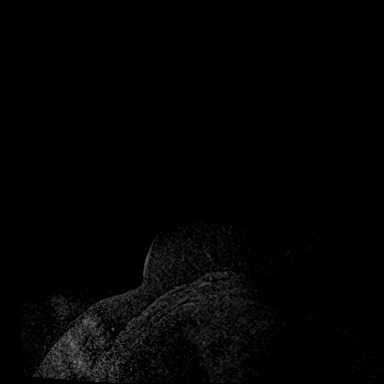
[im 18/144]
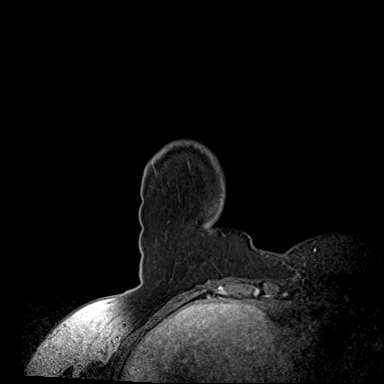
[im 36/144]
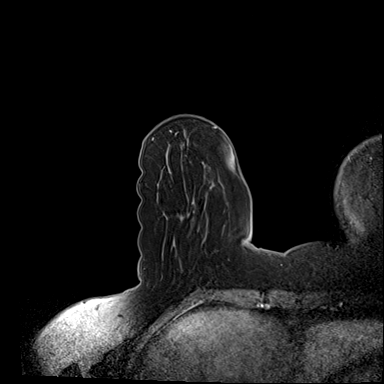
[im 54/144]
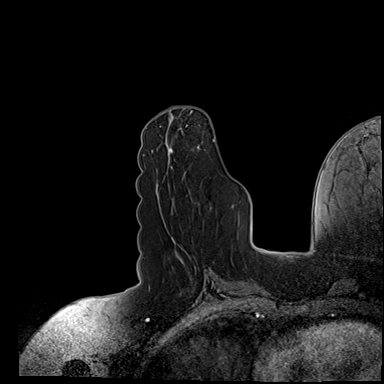
[im 72/144]
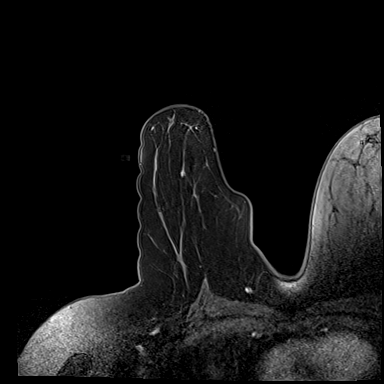
[im 90/144]
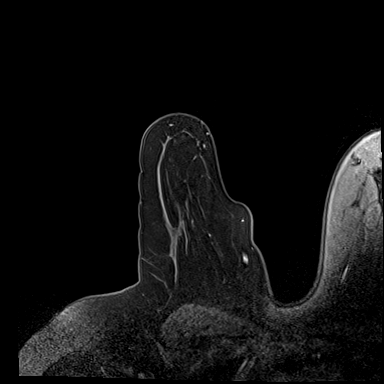
[im 108/144]
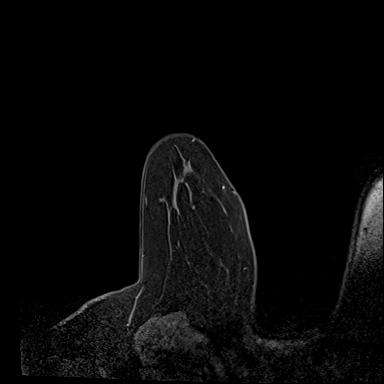
[im 126/144]
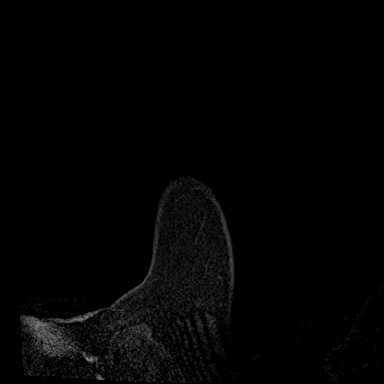
[im 144/144]
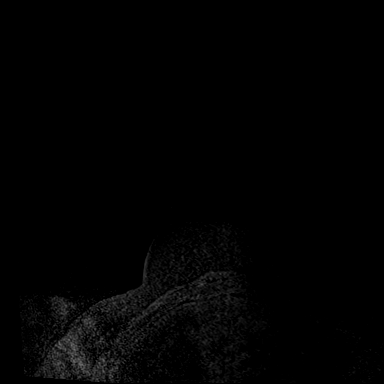

[Series 5: dynamic post 20 · axial · 1.3mm · 0.73mm/px · z∈[-78,+108]mm · 9 of 144 slices shown (2 of 2)]
[im 1/144]
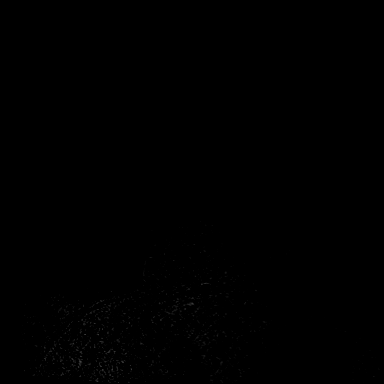
[im 18/144]
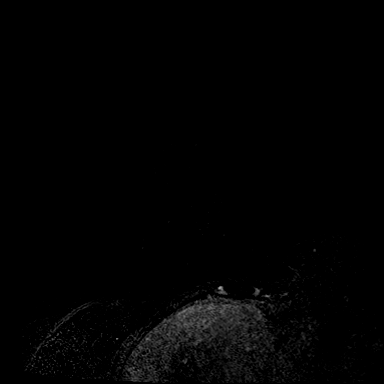
[im 36/144]
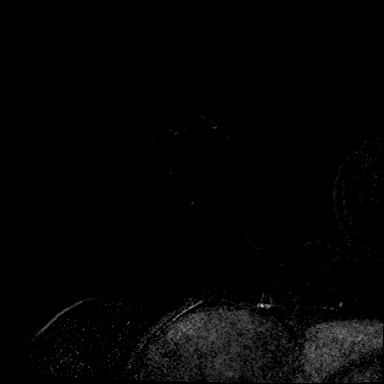
[im 54/144]
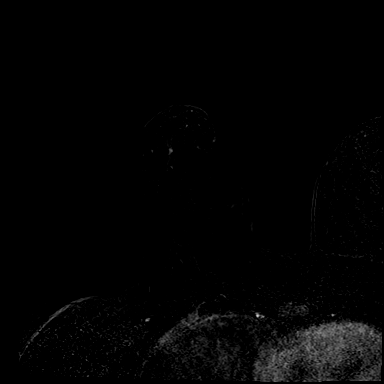
[im 72/144]
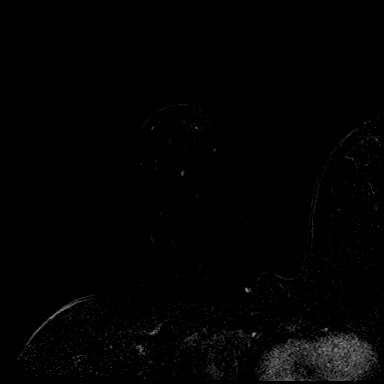
[im 90/144]
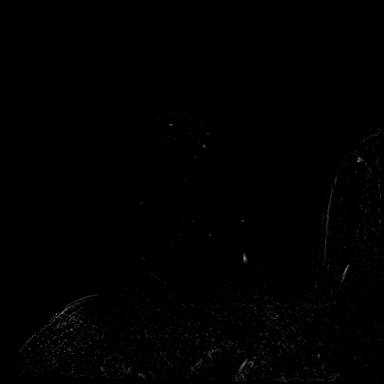
[im 108/144]
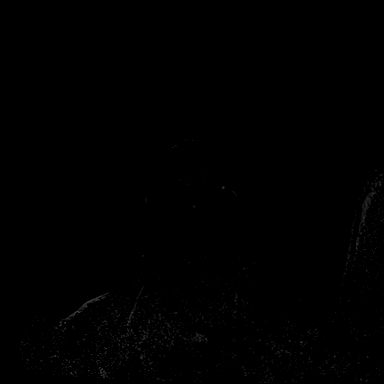
[im 126/144]
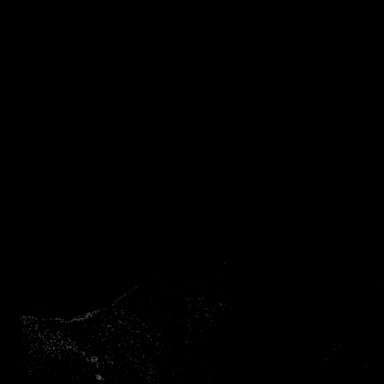
[im 144/144]
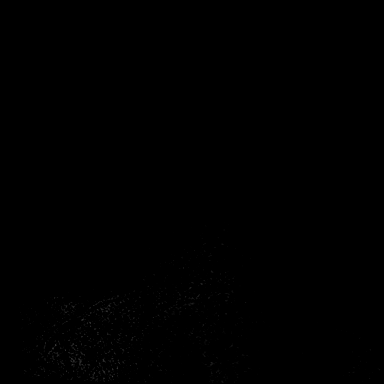

[Series 6: needle confirmation · axial · 1.3mm · 0.73mm/px · z∈[-78,-56]mm · 2 of 144 slices shown]
[im 1/144]
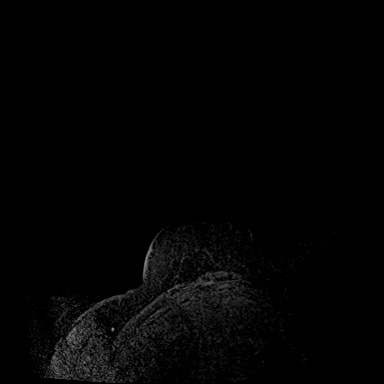
[im 18/144]
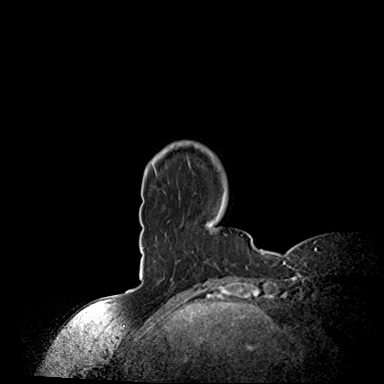

[32 of 48 positions shown; findings below may reference images not displayed]

FINDINGS: I met with the patient, and we discussed the procedure of MRI guided
biopsy, including risks, benefits, and alternatives. Specifically,
we discussed the risks of infection, bleeding, tissue injury, clip
migration, and inadequate sampling. Informed, written consent was
given. The usual time out protocol was performed immediately prior
to the procedure.

Using sterile technique, 1% Lidocaine, MRI guidance, and a 9 gauge
vacuum assisted device, biopsy was performed of the superior mass in
the right breast using a lateral approach. At the conclusion of the
procedure, a dumbbell shaped tissue marker clip was deployed into
the biopsy cavity. Follow-up 2-view mammogram was performed and
dictated separately.

Using sterile technique, 1% Lidocaine, MRI guidance, and a 9 gauge
vacuum assisted device, biopsy was performed of the upper-outer
quadrant right breast mass using a lateral approach. At the
conclusion of the procedure, a cylinder shaped tissue marker clip
was deployed into the biopsy cavity. Follow-up 2-view mammogram was
performed and dictated separately.
IMPRESSION: MRI guided biopsy of 2 right breast masses. No apparent
complications.

ADDENDUM:
Pathology revealed FIBROCYSTIC CHANGE. DUCT EPITHELIAL HYPERPLASIA.
PSEUDOANGIOMATOUS STROMAL HYPERPLASIA (PASH) of the RIGHT breast,
superior. This was found to be concordant by Dr. STRIBLING.

Pathology revealed COMPLEX SCLEROSING LESION of the RIGHT breast,
upper outer. This was found to be concordant by Dr. STRIBLING,
with excision recommended.

Pathology results were discussed with the patient by telephone. The
patient reported doing well after the biopsies with tenderness at
the sites. Post biopsy instructions and care were reviewed and
questions were answered. The patient was encouraged to call The

Surgical consultation has been arranged with Dr. STRIBLING at
[REDACTED] on [DATE]. Bilateral breast MRI
recommended in 6 months per protocol to follow LEFT and RIGHT
non-surgical sites.

Pathology results reported by STRIBLING RN on [DATE].

*** End of Addendum ***
FINDINGS: I met with the patient, and we discussed the procedure of MRI guided
biopsy, including risks, benefits, and alternatives. Specifically,
we discussed the risks of infection, bleeding, tissue injury, clip
migration, and inadequate sampling. Informed, written consent was
given. The usual time out protocol was performed immediately prior
to the procedure.

Using sterile technique, 1% Lidocaine, MRI guidance, and a 9 gauge
vacuum assisted device, biopsy was performed of the superior mass in
the right breast using a lateral approach. At the conclusion of the
procedure, a dumbbell shaped tissue marker clip was deployed into
the biopsy cavity. Follow-up 2-view mammogram was performed and
dictated separately.

Using sterile technique, 1% Lidocaine, MRI guidance, and a 9 gauge
vacuum assisted device, biopsy was performed of the upper-outer
quadrant right breast mass using a lateral approach. At the
conclusion of the procedure, a cylinder shaped tissue marker clip
was deployed into the biopsy cavity. Follow-up 2-view mammogram was
performed and dictated separately.
IMPRESSION: MRI guided biopsy of 2 right breast masses. No apparent
complications.

## 2019-09-18 MED ORDER — GADOBUTROL 1 MMOL/ML IV SOLN
10.0000 mL | Freq: Once | INTRAVENOUS | Status: AC | PRN
  Administered 2019-09-18: 10:00:00 10 mL via INTRAVENOUS

## 2019-10-01 ENCOUNTER — Ambulatory Visit: Payer: Self-pay | Admitting: Surgery

## 2019-10-01 DIAGNOSIS — N6489 Other specified disorders of breast: Secondary | ICD-10-CM

## 2019-10-01 DIAGNOSIS — Z9189 Other specified personal risk factors, not elsewhere classified: Secondary | ICD-10-CM | POA: Diagnosis not present

## 2019-10-01 NOTE — H&P (Signed)
Amanda Rasmussen [id number] Documented: 10/01/2019 11:25 AM Location: [facility] Patient #: [name] DOB: 12/02/1980 Single / Language: Amanda Rasmussen / Race: White Female  History of Present Illness Marcello Moores A. Shemicka Cohrs MD; 10/01/2019 12:23 PM) Patient words: Patient sent at the request of Dr. Jeanmarie Plant of the [facility] due to abnormal mammogram. Patient underwent bilateral screening mammograms with subsequent diagnostic mammograms which showed 4 areas of abnormal enhancement to each breast. She underwent subsequent magnetic resonance imaging and core biopsy of 2-4 areas one on each side which showed radial scar with atypia. She has 4 first for relatives with breast cancer 2 on each side. She underwent genetic testing she stated last year by her gynecologist which was "negative". She also has another with prostate cancer. She has no complaints except for bruising in both breasts.      CLINICAL DATA: 39 year old female with a very strong family history of breast cancer presenting for high risk screening MRI.  LABS: None performed on site.  EXAM: BILATERAL BREAST MRI WITH AND WITHOUT CONTRAST  TECHNIQUE: Multiplanar, multisequence MR images of both breasts were obtained prior to and following the intravenous administration of 9 ml of Gadavist.  Three-dimensional MR images were rendered by post-processing of the original MR data on an independent workstation. The three-dimensional MR images were interpreted, and findings are reported in the following complete MRI report for this study. Three dimensional images were evaluated at the independent DynaCad workstation  COMPARISON: Previous exam(s).  FINDINGS: Breast composition: b. Scattered fibroglandular tissue.  Background parenchymal enhancement: Moderate.  Right breast: There are several 3-4 mm enhancing foci scattered throughout the right breast. A slightly irregular 4 mm focus without associated T2 correlate is  seen in the upper outer quadrant anteriorly (series 7, image 54/112). A 5 mm focus is seen in the superior central aspect anteriorly (series 7, image 56/112). Additional foci are noted as marked on PACS series 7.  Left breast: Suspicious non mass enhancement is identified in the upper outer quadrant extending from mid to anterior depth (series 7, images 70-79/112). It spans 5 x 1.2 x 0.9 cm (AP by transverse by craniocaudal dimensions). No other dominant mass or suspicious enhancement.  Lymph nodes: No abnormal appearing lymph nodes.  Ancillary findings: None.  IMPRESSION: 1. Suspicious non-mass enhancement in the upper outer left breast spanning from mid to anterior depth by approximately 5 cm. Recommendation is for MRI guided biopsy along the anterior and posterior aspects. 2. Several indeterminate 3-4 mm enhancing foci scattered throughout the right breast. A 4 mm focus in the upper outer quadrant (series 7, image 54/112) demonstrates a slightly irregular shape without T2 correlate. Recommendation is that the patient return on a separate day for 2 area MRI guided biopsy of the right breast to include this 4 mm focus as well as 1 additional focus at the discretion of the performing radiologist. 3. No suspicious lymphadenopathy.  RECOMMENDATION: A total of 4 MRI guided biopsies are recommended. The should be performed on 2 separate days, with the patient returning first for a 2 area biopsy on the left and subsequently for a 2 area biopsy on the right.  BI-RADS CATEGORY 4: Suspicious.   Electronically Signed By: Kristopher Oppenheim M.D. On: 08/27/2019 12:29   Diagnosis 1. Breast, left, needle core biopsy, anterior aspect 5cm NME UOQ - COLUMNAR CELL AND FIBROCYSTIC CHANGES WITH APOCRINE METAPLASIA - PSEUDOANGIOMATOUS STROMAL HYPERPLASIA - CALCIFICATIONS - NO MALIGNANCY IDENTIFIED 2. Breast, left, needle core biopsy, posterior aspect 5cm NME UOQ (mid) -  COMPLEX  SCLEROSING LESION WITH ATYPIA - SEE COMMENT        Diagnosis 1. Breast, right, needle core biopsy, superior - FIBROCYSTIC CHANGE. 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 2. Breast, right, needle core biopsy, upper outer - COMPLEX SCLEROSING LESION.  The patient is a 39 year old female.   Past Surgical History [facility] Teressa Rasmussen, [place]; 10/01/2019 11:25 AM) Breast Biopsy Bilateral. Gallbladder Surgery - Open Liver Surgery  Diagnostic Studies History (Amanda Rasmussen, CMA; 10/01/2019 11:25 AM) Colonoscopy 1-5 years ago Mammogram within last year Pap Smear 1-5 years ago  Allergies Emmaline Kluver Teressa Rasmussen, CMA; 10/01/2019 11:26 AM) Penicillin G Benzathine & Proc *PENICILLINS* Allergies Reconciled  Medication History (Amanda Rasmussen, CMA; 10/01/2019 11:26 AM) Desvenlafaxine Succinate ER (50MG  Tablet ER 24HR, Oral) Active. Medications Reconciled  Social History (Amanda Rasmussen, CMA; 10/01/2019 11:25 AM) Caffeine use Carbonated beverages, Coffee, Tea. No alcohol use No drug use Tobacco use Former smoker.  Family History Antonietta Rasmussen, [place]; 10/01/2019 11:25 AM) Arthritis Father. Breast Cancer Family Members In General. Depression Father, Mother, Sister. Diabetes Mellitus Father. Thyroid problems Mother.  Pregnancy / Birth History Antonietta Rasmussen, [place]; 10/01/2019 11:25 AM) Age at menarche 17 years. Contraceptive History Depo-provera, Oral contraceptives. Gravida 0 Irregular periods Para 0  Other Problems (Amanda Rasmussen, CMA; 10/01/2019 11:25 AM) Anxiety Disorder Arthritis Depression Gastroesophageal Reflux Disease Heart murmur Lump In Breast     Review of Systems (Amanda Nolan CMA; 10/01/2019 11:25 AM) General Not Present- Appetite Loss, Chills, Fatigue, Fever, Night Sweats, Weight Gain and Weight Loss. Skin Not Present- Change in Wart/Mole, Dryness, Hives, Jaundice, New Lesions, Non-Healing Wounds, Rash and Ulcer. HEENT Present-  Seasonal Allergies. Not Present- Earache, Hearing Loss, Hoarseness, Nose Bleed, Oral Ulcers, Ringing in the Ears, Sinus Pain, Sore Throat, Visual Disturbances, Wears glasses/contact lenses and Yellow Eyes. Respiratory Present- Snoring. Not Present- Bloody sputum, Chronic Cough, Difficulty Breathing and Wheezing. Breast Present- Breast Mass. Not Present- Breast Pain, Nipple Discharge and Skin Changes. Cardiovascular Not Present- Chest Pain, Difficulty Breathing Lying Down, Leg Cramps, Palpitations, Rapid Heart Rate, Shortness of Breath and Swelling of Extremities. Gastrointestinal Present- Chronic diarrhea. Not Present- Abdominal Pain, Bloating, Bloody Stool, Change in Bowel Habits, Constipation, Difficulty Swallowing, Excessive gas, Gets full quickly at meals, Hemorrhoids, Indigestion, Nausea, Rectal Pain and Vomiting. Female Genitourinary Not Present- Frequency, Nocturia, Painful Urination, Pelvic Pain and Urgency. Musculoskeletal Not Present- Back Pain, Joint Pain, Joint Stiffness, Muscle Pain, Muscle Weakness and Swelling of Extremities. Neurological Not Present- Decreased Memory, Fainting, Headaches, Numbness, Seizures, Tingling, Tremor, Trouble walking and Weakness. Psychiatric Present- Anxiety and Depression. Not Present- Bipolar, Change in Sleep Pattern, Fearful and Frequent crying. Hematology Present- Easy Bruising. Not Present- Blood Thinners, Excessive bleeding, Gland problems, HIV and Persistent Infections.  Vitals (Amanda Nolan CMA; 10/01/2019 11:26 AM) 10/01/2019 11:26 AM Weight: 212.13 lb Height: 60in Body Surface Area: 1.91 m Body Mass Index: 41.43 kg/m  Temp.: 97.58F  Pulse: 92 (Regular)  BP: 116/78 (Sitting, Left Arm, Standard)        Physical Exam (Mikaiah Stoffer A. Yamileth Hayse MD; 10/01/2019 12:24 PM)  General Mental Status-Alert. General Appearance-Consistent with stated age. Hydration-Well hydrated. Voice-Normal.  Head and Neck Head-normocephalic,  atraumatic with no lesions or palpable masses. Trachea-midline. Thyroid Gland Characteristics - normal size and consistency.  Chest and Lung Exam Note: wob normal  Breast Breast - Left-Symmetric, Non Tender, No Biopsy scars, no Dimpling - Left, No Inflammation, No Lumpectomy scars, No Mastectomy scars, No Peau d' Orange. Breast - Right-Symmetric, Non Tender, No Biopsy scars, no Dimpling -  Right, No Inflammation, No Lumpectomy scars, No Mastectomy scars, No Peau d' Orange. Breast Lump-No Palpable Breast Mass.  Cardiovascular Note: NSR  Neurologic Neurologic evaluation reveals -alert and oriented x 3 with no impairment of recent or remote memory. Mental Status-Normal.  Musculoskeletal Normal Exam - Left-Upper Extremity Strength Normal and Lower Extremity Strength Normal. Normal Exam - Right-Upper Extremity Strength Normal and Lower Extremity Strength Normal.  Lymphatic Head & Neck  General Head & Neck Lymphatics: Bilateral - Description - Normal. Axillary  General Axillary Region: Bilateral - Description - Normal. Tenderness - Non Tender.    Assessment & Plan (Zygmunt Mcglinn A. 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 Pt agrees to proceed.  45 minutes total time spent counseling, face-to-face, reviewing chart, reviewing mammograms, reviewing pathology report, reviewing also states, and documentation   RADIAL SCAR OF RIGHT BREAST (N64.89)  Current Plans You are being scheduled for surgery- Our schedulers will call you.  You should hear from our office's scheduling department within 5 working days about the location, date, and time of surgery. We try to make  accommodations for patient's preferences in scheduling surgery, but sometimes the OR schedule or the surgeon's schedule prevents [place] from making those accommodations.  If you have not heard from our office [phone]) in 5 working days, call the office and ask for your surgeon's nurse.  If you have other questions about your diagnosis, plan, or surgery, call the office and ask for your surgeon's nurse.  Pt Education - [facility] Breast Biopsy HCI: discussed with patient and provided information.  AT HIGH RISK FOR BREAST CANCER (Z91.89) Impression: lifetime risk 23.6 % Discussed risks prevent extremities to include diet, proper BMI maintenance, exercise, alcohol cessation, medications surgery. She has undergone genetic testing last year which was negative She will send me a copy of this to make shorts up-to-date.

## 2019-10-04 ENCOUNTER — Ambulatory Visit
Admission: RE | Admit: 2019-10-04 | Discharge: 2019-10-04 | Disposition: A | Discharge status: Home / Self Care | Payer: Self-pay | Source: Ambulatory Visit | Attending: Surgery | Admitting: Surgery

## 2019-10-04 ENCOUNTER — Other Ambulatory Visit: Payer: Self-pay | Admitting: Surgery

## 2019-10-04 DIAGNOSIS — N6489 Other specified disorders of breast: Secondary | ICD-10-CM

## 2019-11-07 ENCOUNTER — Encounter (HOSPITAL_BASED_OUTPATIENT_CLINIC_OR_DEPARTMENT_OTHER): Payer: Self-pay | Admitting: Surgery

## 2019-11-07 ENCOUNTER — Other Ambulatory Visit: Payer: Self-pay

## 2019-11-12 ENCOUNTER — Other Ambulatory Visit (HOSPITAL_COMMUNITY)
Admission: RE | Admit: 2019-11-12 | Discharge: 2019-11-12 | Disposition: A | Discharge status: Home / Self Care | Payer: Federal, State, Local not specified - PPO | Source: Ambulatory Visit | Attending: Surgery | Admitting: Surgery

## 2019-11-12 ENCOUNTER — Encounter (HOSPITAL_BASED_OUTPATIENT_CLINIC_OR_DEPARTMENT_OTHER)
Admission: RE | Admit: 2019-11-12 | Discharge: 2019-11-12 | Disposition: A | Discharge status: Home / Self Care | Payer: Federal, State, Local not specified - PPO | Source: Ambulatory Visit | Attending: Surgery | Admitting: Surgery

## 2019-11-12 DIAGNOSIS — Z20822 Contact with and (suspected) exposure to covid-19: Secondary | ICD-10-CM | POA: Diagnosis not present

## 2019-11-12 DIAGNOSIS — N6489 Other specified disorders of breast: Secondary | ICD-10-CM | POA: Insufficient documentation

## 2019-11-12 DIAGNOSIS — Z01812 Encounter for preprocedural laboratory examination: Secondary | ICD-10-CM | POA: Insufficient documentation

## 2019-11-12 LAB — CBC WITH DIFFERENTIAL/PLATELET
Abs Immature Granulocytes: 0.02 10*3/uL (ref 0.00–0.07)
Basophils Absolute: 0.1 10*3/uL (ref 0.0–0.1)
Basophils Relative: 1 %
Eosinophils Absolute: 0.1 10*3/uL (ref 0.0–0.5)
Eosinophils Relative: 1 %
HCT: 42.7 % (ref 36.0–46.0)
Hemoglobin: 13.2 g/dL (ref 12.0–15.0)
Immature Granulocytes: 0 %
Lymphocytes Relative: 29 %
Lymphs Abs: 2.6 10*3/uL (ref 0.7–4.0)
MCH: 25.6 pg — ABNORMAL LOW (ref 26.0–34.0)
MCHC: 30.9 g/dL (ref 30.0–36.0)
MCV: 82.9 fL (ref 80.0–100.0)
Monocytes Absolute: 0.7 10*3/uL (ref 0.1–1.0)
Monocytes Relative: 8 %
Neutro Abs: 5.4 10*3/uL (ref 1.7–7.7)
Neutrophils Relative %: 61 %
Platelets: 286 10*3/uL (ref 150–400)
RBC: 5.15 MIL/uL — ABNORMAL HIGH (ref 3.87–5.11)
RDW: 14.1 % (ref 11.5–15.5)
WBC: 8.9 10*3/uL (ref 4.0–10.5)
nRBC: 0 % (ref 0.0–0.2)

## 2019-11-12 LAB — COMPREHENSIVE METABOLIC PANEL
ALT: 13 U/L (ref 0–44)
AST: 16 U/L (ref 15–41)
Albumin: 3.5 g/dL (ref 3.5–5.0)
Alkaline Phosphatase: 74 U/L (ref 38–126)
Anion gap: 8 (ref 5–15)
BUN: 12 mg/dL (ref 6–20)
CO2: 24 mmol/L (ref 22–32)
Calcium: 9 mg/dL (ref 8.9–10.3)
Chloride: 104 mmol/L (ref 98–111)
Creatinine, Ser: 0.6 mg/dL (ref 0.44–1.00)
GFR calc Af Amer: >60 mL/min (ref >60)
GFR calc non Af Amer: >60 mL/min (ref >60)
Glucose, Bld: 102 mg/dL — ABNORMAL HIGH (ref 70–99)
Potassium: 4.5 mmol/L (ref 3.5–5.1)
Sodium: 136 mmol/L (ref 135–145)
Total Bilirubin: 0.4 mg/dL (ref 0.3–1.2)
Total Protein: 7.4 g/dL (ref 6.5–8.1)

## 2019-11-12 LAB — POCT PREGNANCY, URINE: Preg Test, Ur: NEGATIVE

## 2019-11-12 LAB — SARS CORONAVIRUS 2 (TAT 6-24 HRS): SARS Coronavirus 2: NEGATIVE

## 2019-11-12 NOTE — Progress Notes (Signed)

## 2019-11-14 ENCOUNTER — Other Ambulatory Visit: Payer: Self-pay

## 2019-11-14 ENCOUNTER — Ambulatory Visit
Admission: RE | Admit: 2019-11-14 | Discharge: 2019-11-14 | Disposition: A | Discharge status: Home / Self Care | Payer: Federal, State, Local not specified - PPO | Source: Ambulatory Visit | Attending: Surgery | Admitting: Surgery

## 2019-11-14 DIAGNOSIS — N6489 Other specified disorders of breast: Secondary | ICD-10-CM

## 2019-11-14 DIAGNOSIS — R928 Other abnormal and inconclusive findings on diagnostic imaging of breast: Secondary | ICD-10-CM | POA: Diagnosis not present

## 2019-11-14 IMAGING — MG MM PLC BREAST LOC DEV 1ST LESION INC MAMMO GUIDE*L*
7 series · 7 of 7 positions shown · non-contrast
Comparison: Previous exam(s).

CLINICAL DATA: 38-year-old patient presents for radioactive seed
localization of both breast prior to excisional biopsies of
bilateral complex sclerosing lesions. This report describes the left
breast localization.

EXAM:
MAMMOGRAPHIC GUIDED RADIOACTIVE SEED LOCALIZATION OF THE LEFT BREAST

[L LM (1 of 3)]
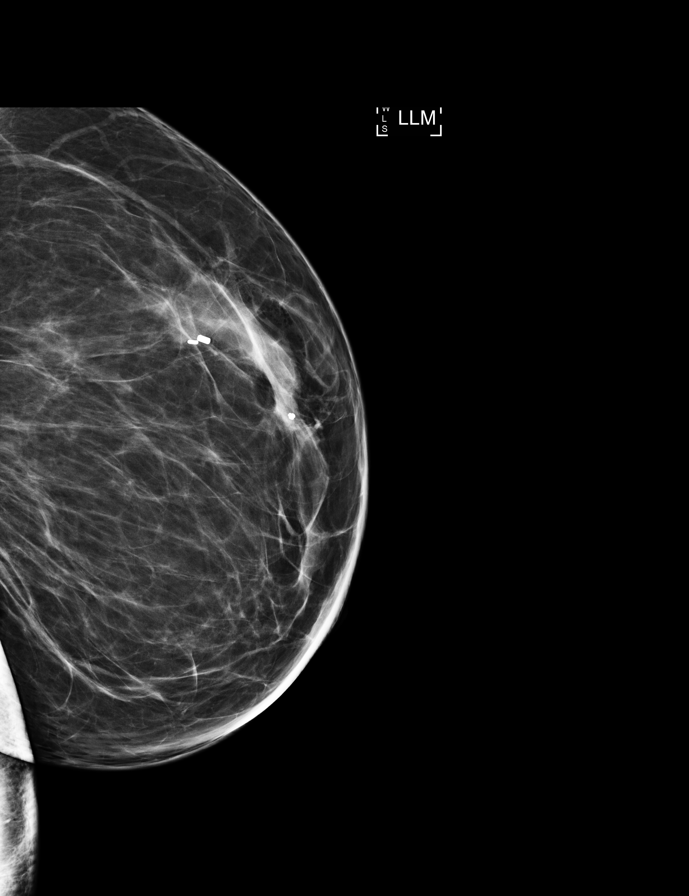

[L CC (1 of 4)]
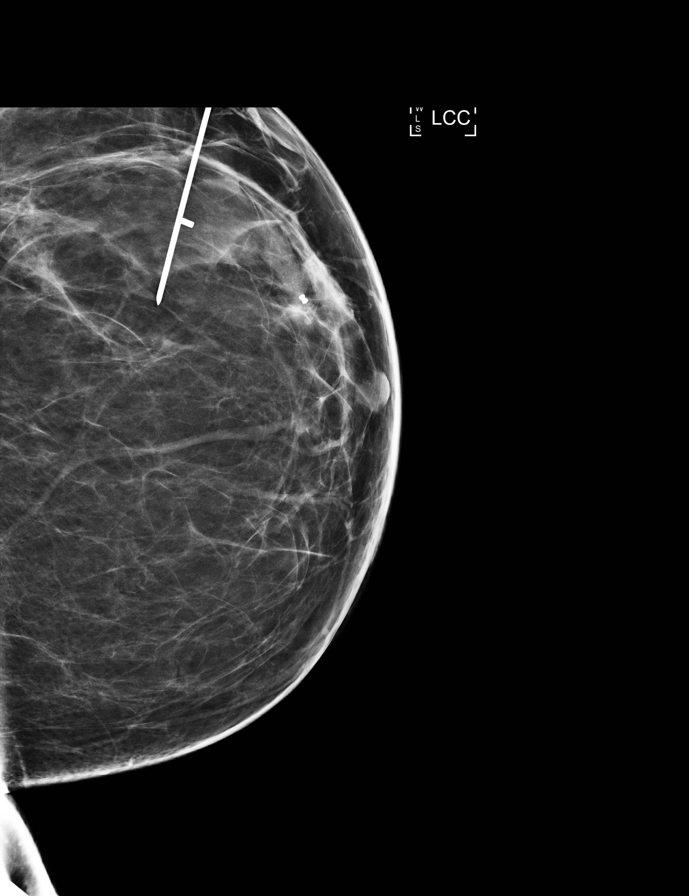

[L LM (2 of 3)]
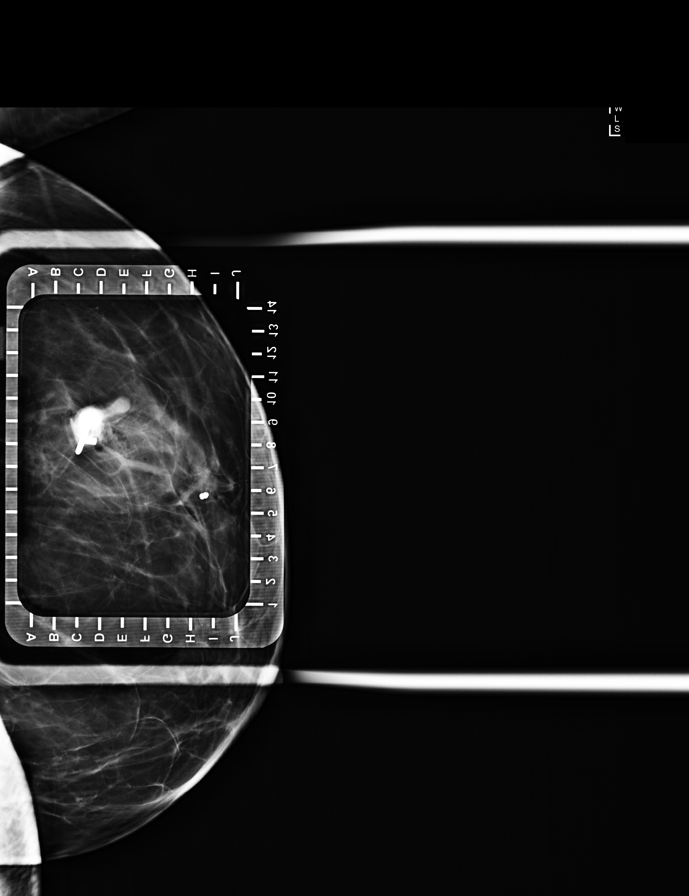

[L CC (2 of 4)]
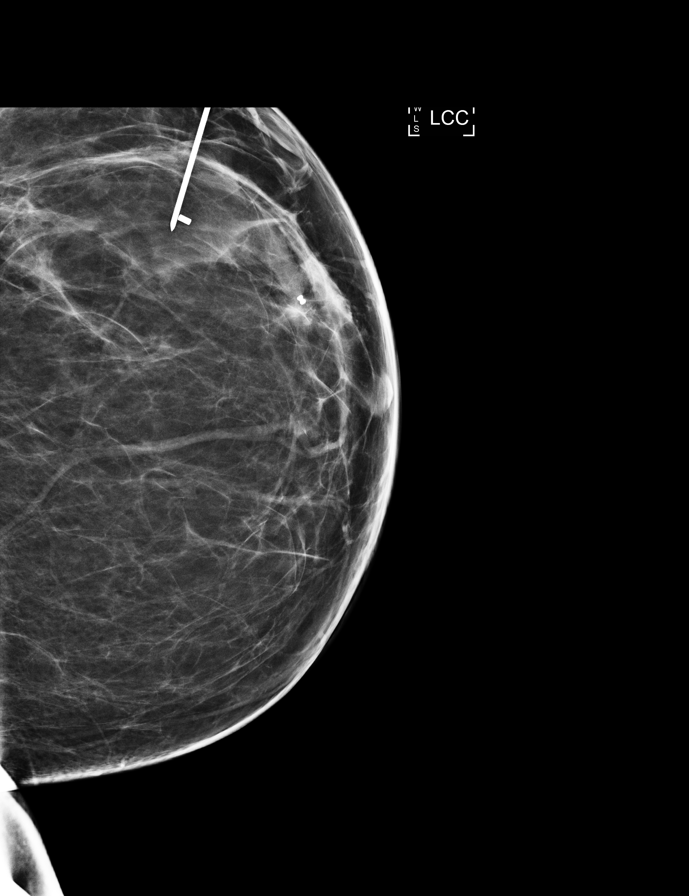

[L CC (3 of 4)]
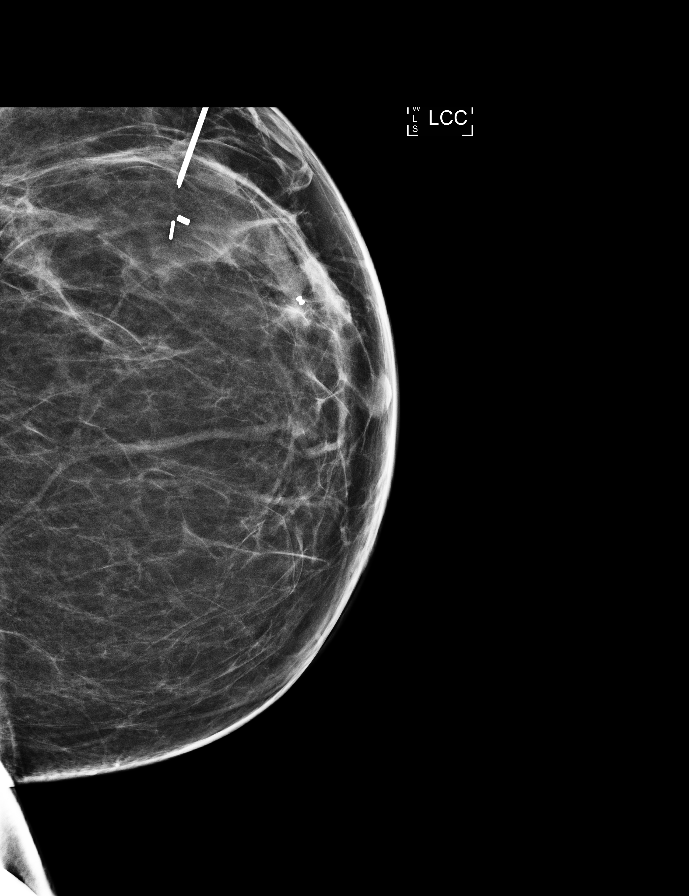

[L CC (4 of 4)]
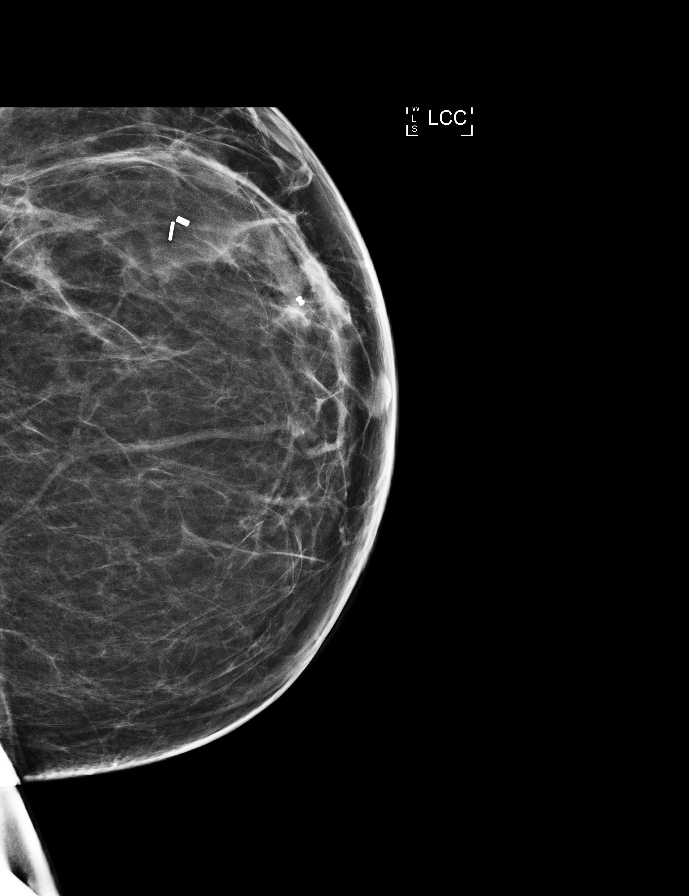

[L LM (3 of 3)]
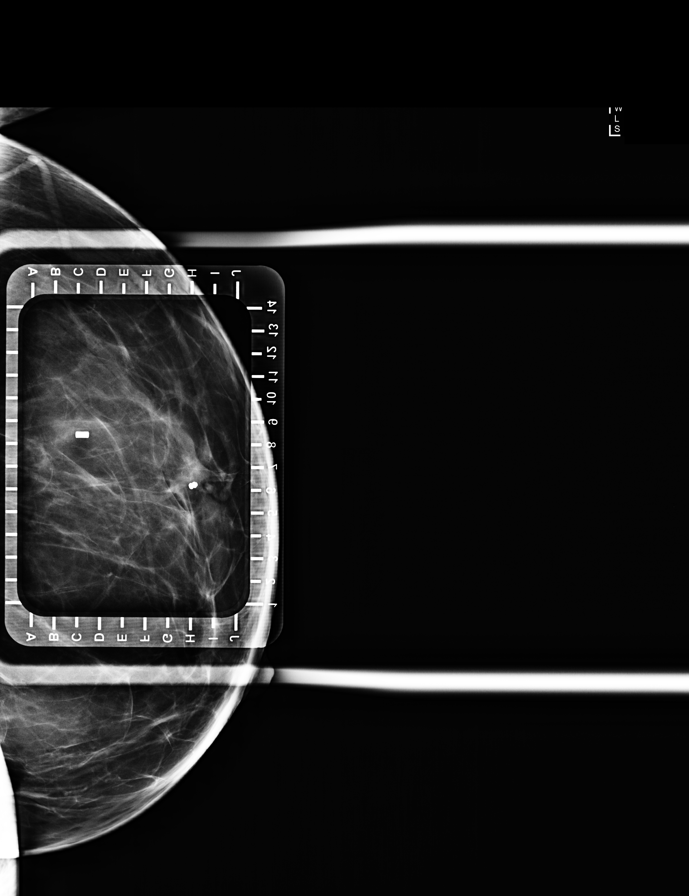

[7 of 7 positions shown; findings below may reference images not displayed]

FINDINGS: Patient presents for radioactive seed localization prior to
excisional biopsy. I met with the patient and we discussed the
procedure of seed localization including benefits and alternatives.
We discussed the high likelihood of a successful procedure. We
discussed the risks of the procedure including infection, bleeding,
tissue injury and further surgery. We discussed the low dose of
radioactivity involved in the procedure. Informed, written consent
was given.

The usual time-out protocol was performed immediately prior to the
procedure.

Using mammographic guidance, sterile technique, 1% lidocaine and an
[MZ] radioactive seed, the cylindrical biopsy clip in the middle
third of the left breast, upper outer quadrant, was localized using
a lateral approach. The follow-up mammogram images confirm the seed
in the expected location and were marked for Dr. DEASIN.

Follow-up survey of the patient confirms presence of the radioactive
seed.

Order number of [MZ] seed:  [PHONE_NUMBER].

Total activity: 0.247 mCi reference Date: [DATE]

The patient tolerated the procedure well and was released from the
[REDACTED]. She was given instructions regarding seed removal.
IMPRESSION: Radioactive seed localization left breast. No apparent
complications.

## 2019-11-14 IMAGING — MG MM PLC BREAST LOC DEV 1ST LESION INC*R*
7 series · 7 of 7 positions shown · non-contrast
Comparison: Previous exam(s).

CLINICAL DATA: Radioactive seed localization of biopsy-proven
complex sclerosing lesion in the right breast requested prior to
excisional biopsy.

EXAM:
MAMMOGRAPHIC GUIDED RADIOACTIVE SEED LOCALIZATION OF THE RIGHT
BREAST

[R CC (1 of 4)]
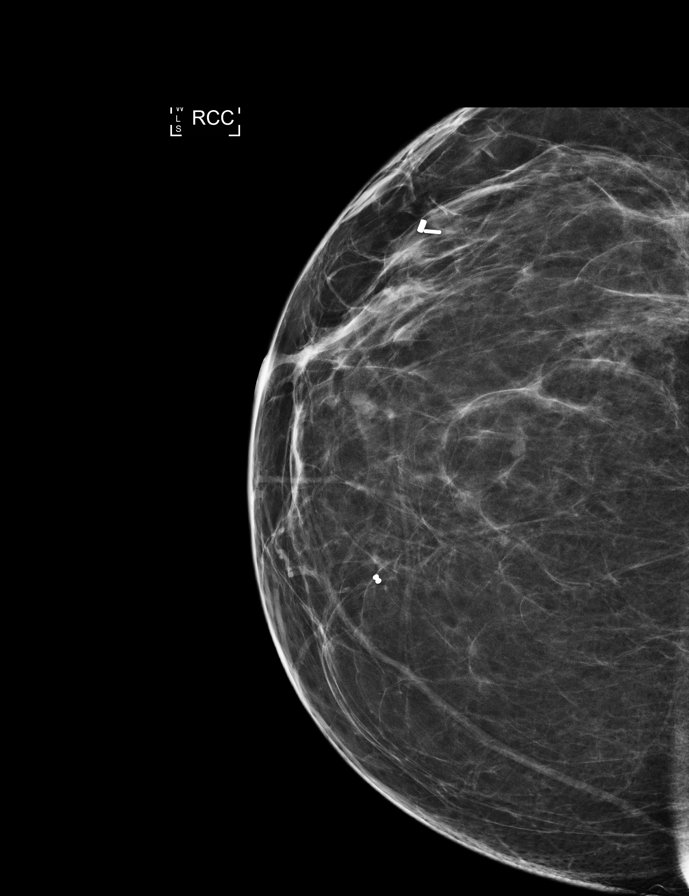

[R LM (1 of 3)]
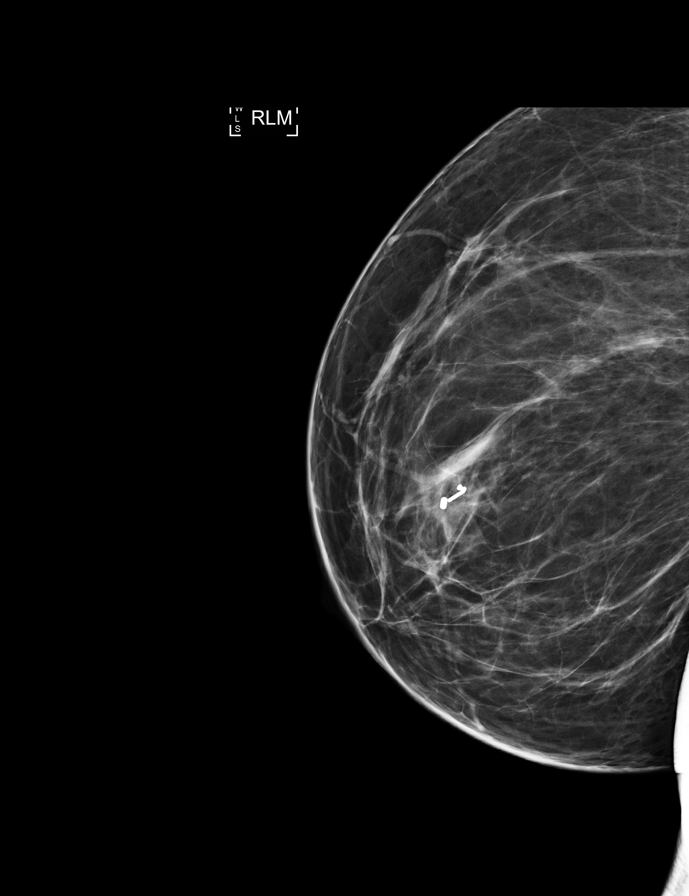

[R CC (2 of 4)]
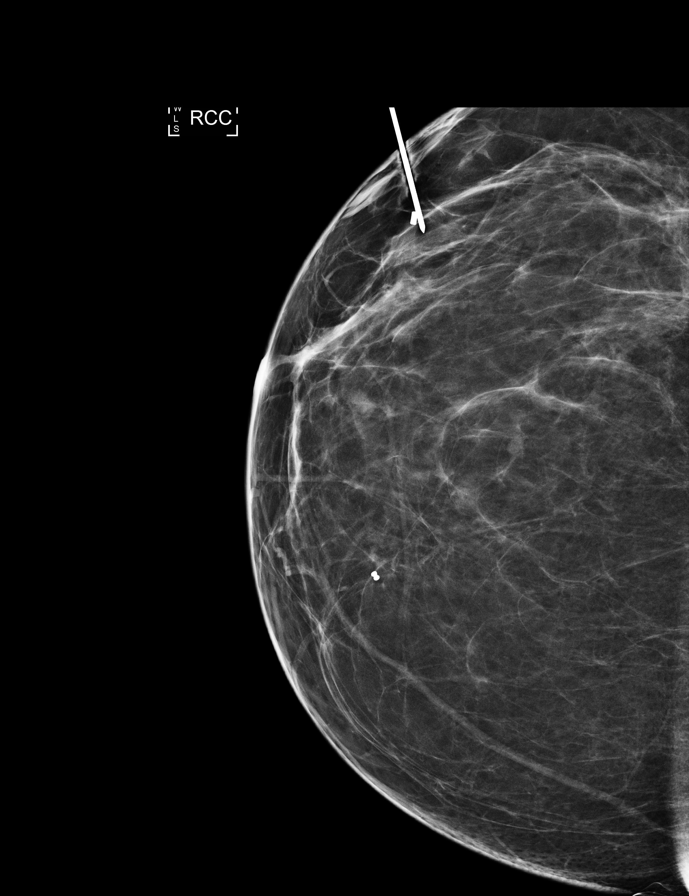

[R LM (2 of 3)]
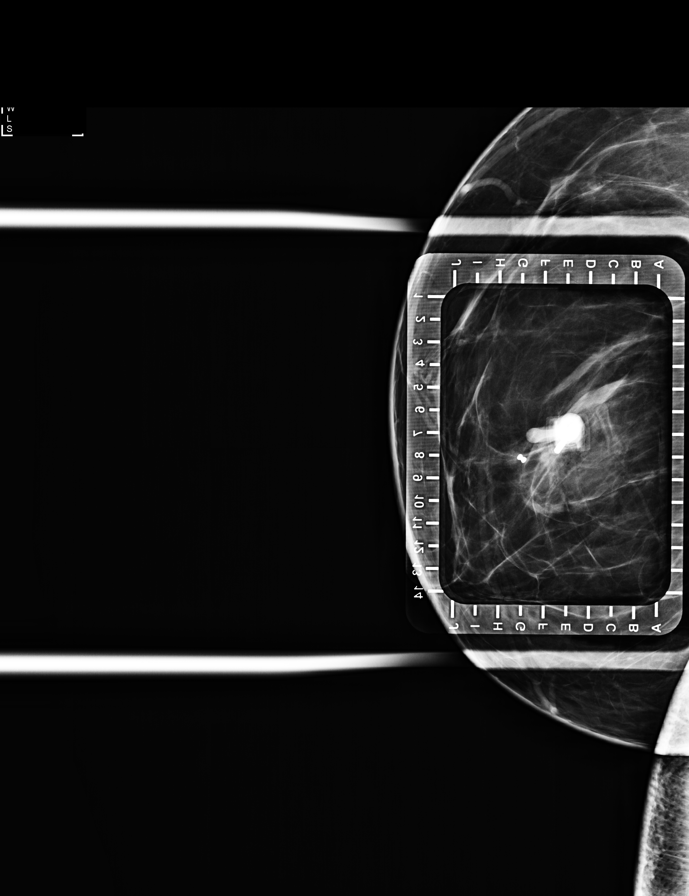

[R LM (3 of 3)]
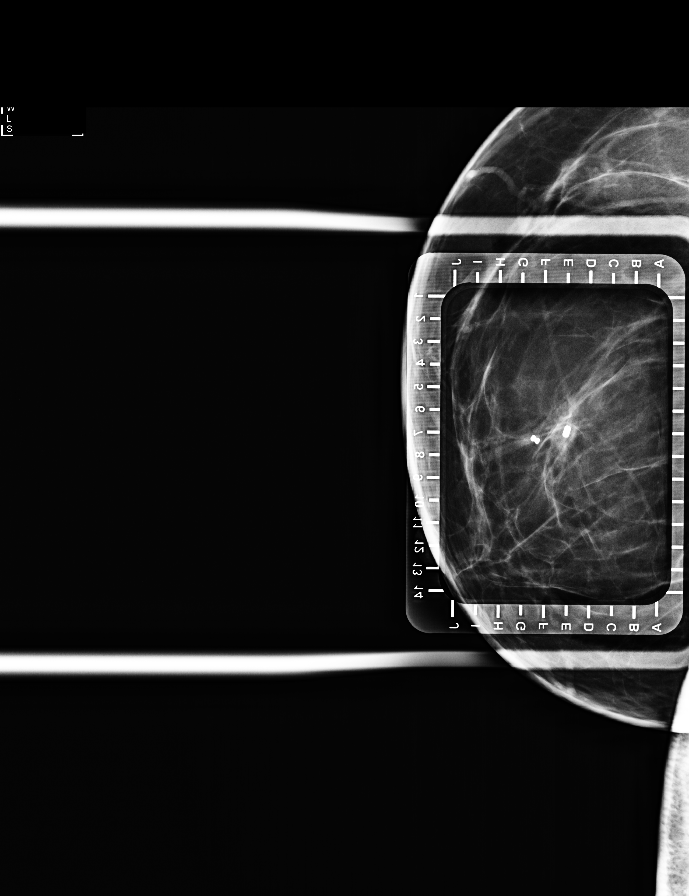

[R CC (3 of 4)]
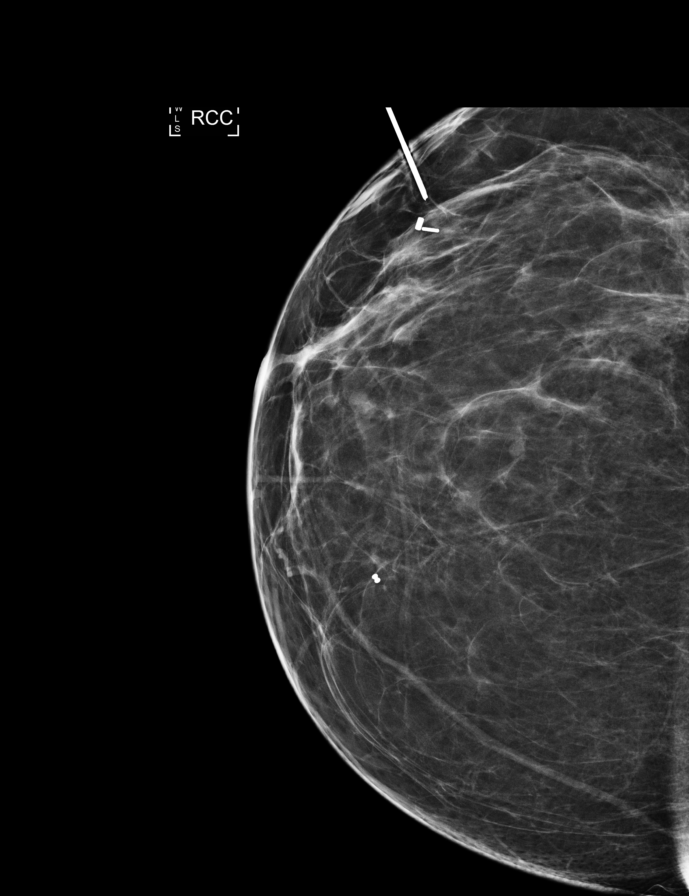

[R CC (4 of 4)]
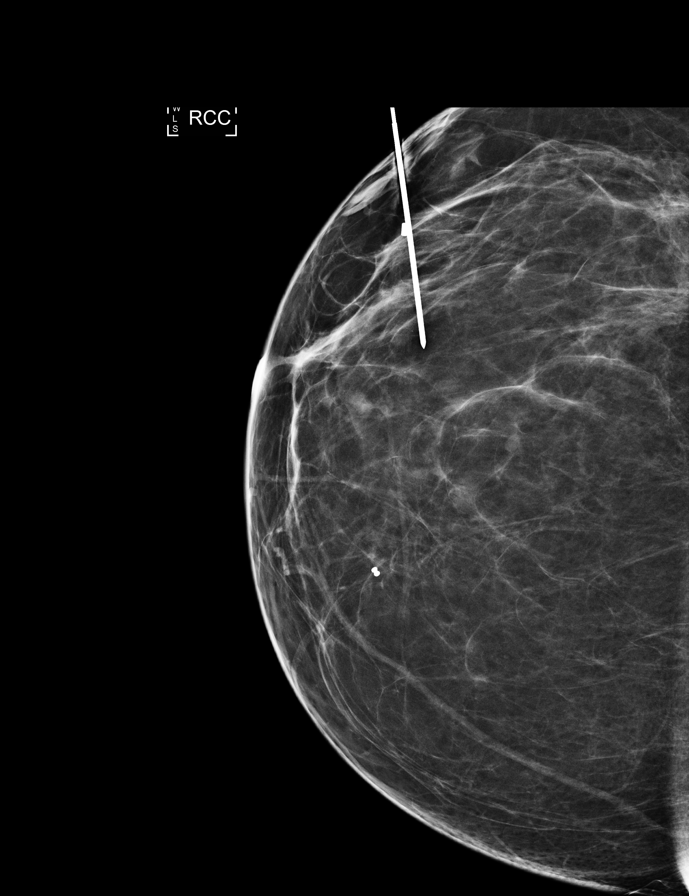

[7 of 7 positions shown; findings below may reference images not displayed]

FINDINGS: Patient presents for radioactive seed localization prior to
excisional biopsy. I met with the patient and we discussed the
procedure of seed localization including benefits and alternatives.
We discussed the high likelihood of a successful procedure. We
discussed the risks of the procedure including infection, bleeding,
tissue injury and further surgery. We discussed the low dose of
radioactivity involved in the procedure. Informed, written consent
was given.

The usual time-out protocol was performed immediately prior to the
procedure.

Using mammographic guidance, sterile technique, 1% lidocaine and an
[8V] radioactive seed, cylindrical biopsy clip in the upper outer
right breast was localized using a lateral approach. The follow-up
mammogram images confirm the seed in the expected location and were
marked for Dr. GOKHALE.

Follow-up survey of the patient confirms presence of the radioactive
seed.

Order number of [8V] seed:  [PHONE_NUMBER].

Total activity: 0.247 mCi reference Date: [DATE]

The patient tolerated the procedure well and was released from the
[REDACTED]. She was given instructions regarding seed removal.
IMPRESSION: Radioactive seed localization right breast. No apparent
complications.

## 2019-11-15 ENCOUNTER — Ambulatory Visit (HOSPITAL_BASED_OUTPATIENT_CLINIC_OR_DEPARTMENT_OTHER)
Admission: RE | Admit: 2019-11-15 | Discharge: 2019-11-15 | Disposition: A | Discharge status: Home / Self Care | Payer: Federal, State, Local not specified - PPO | Source: Ambulatory Visit | Attending: Surgery | Admitting: Surgery

## 2019-11-15 ENCOUNTER — Ambulatory Visit
Admission: RE | Admit: 2019-11-15 | Discharge: 2019-11-15 | Disposition: A | Discharge status: Home / Self Care | Payer: Federal, State, Local not specified - PPO | Source: Ambulatory Visit | Attending: Surgery | Admitting: Surgery

## 2019-11-15 ENCOUNTER — Encounter (HOSPITAL_BASED_OUTPATIENT_CLINIC_OR_DEPARTMENT_OTHER)
Admission: RE | Disposition: A | Discharge status: Home / Self Care | Payer: Self-pay | Source: Ambulatory Visit | Attending: Surgery

## 2019-11-15 ENCOUNTER — Encounter (HOSPITAL_BASED_OUTPATIENT_CLINIC_OR_DEPARTMENT_OTHER): Payer: Self-pay | Admitting: Surgery

## 2019-11-15 ENCOUNTER — Ambulatory Visit (HOSPITAL_BASED_OUTPATIENT_CLINIC_OR_DEPARTMENT_OTHER): Payer: Federal, State, Local not specified - PPO | Admitting: Anesthesiology

## 2019-11-15 ENCOUNTER — Other Ambulatory Visit: Payer: Self-pay

## 2019-11-15 DIAGNOSIS — R928 Other abnormal and inconclusive findings on diagnostic imaging of breast: Secondary | ICD-10-CM | POA: Diagnosis not present

## 2019-11-15 DIAGNOSIS — K219 Gastro-esophageal reflux disease without esophagitis: Secondary | ICD-10-CM | POA: Insufficient documentation

## 2019-11-15 DIAGNOSIS — D242 Benign neoplasm of left breast: Secondary | ICD-10-CM | POA: Diagnosis not present

## 2019-11-15 DIAGNOSIS — N62 Hypertrophy of breast: Secondary | ICD-10-CM | POA: Insufficient documentation

## 2019-11-15 DIAGNOSIS — N6489 Other specified disorders of breast: Secondary | ICD-10-CM

## 2019-11-15 DIAGNOSIS — F418 Other specified anxiety disorders: Secondary | ICD-10-CM | POA: Diagnosis not present

## 2019-11-15 DIAGNOSIS — N6012 Diffuse cystic mastopathy of left breast: Secondary | ICD-10-CM | POA: Diagnosis not present

## 2019-11-15 DIAGNOSIS — M1990 Unspecified osteoarthritis, unspecified site: Secondary | ICD-10-CM | POA: Insufficient documentation

## 2019-11-15 DIAGNOSIS — F419 Anxiety disorder, unspecified: Secondary | ICD-10-CM | POA: Diagnosis not present

## 2019-11-15 DIAGNOSIS — Z803 Family history of malignant neoplasm of breast: Secondary | ICD-10-CM | POA: Insufficient documentation

## 2019-11-15 DIAGNOSIS — F329 Major depressive disorder, single episode, unspecified: Secondary | ICD-10-CM | POA: Diagnosis not present

## 2019-11-15 DIAGNOSIS — L905 Scar conditions and fibrosis of skin: Secondary | ICD-10-CM | POA: Insufficient documentation

## 2019-11-15 DIAGNOSIS — Z6841 Body Mass Index (BMI) 40.0 and over, adult: Secondary | ICD-10-CM | POA: Insufficient documentation

## 2019-11-15 DIAGNOSIS — N6082 Other benign mammary dysplasias of left breast: Secondary | ICD-10-CM | POA: Diagnosis not present

## 2019-11-15 DIAGNOSIS — D241 Benign neoplasm of right breast: Secondary | ICD-10-CM | POA: Diagnosis not present

## 2019-11-15 HISTORY — PX: BREAST LUMPECTOMY WITH RADIOACTIVE SEED LOCALIZATION: SHX6424

## 2019-11-15 HISTORY — PX: BREAST EXCISIONAL BIOPSY: SUR124

## 2019-11-15 IMAGING — DX MM BREAST SURGICAL SPECIMEN
1 series · 2 of 2 positions shown · non-contrast
Comparison: Previous exam(s).

CLINICAL DATA: Evaluate specimen

EXAM:
SPECIMEN RADIOGRAPH OF THE RIGHT BREAST

[Series 2: specimen digital x-ray, derived · right · 0.10mm/px · 2 of 2 slices shown]
[im 1/2]
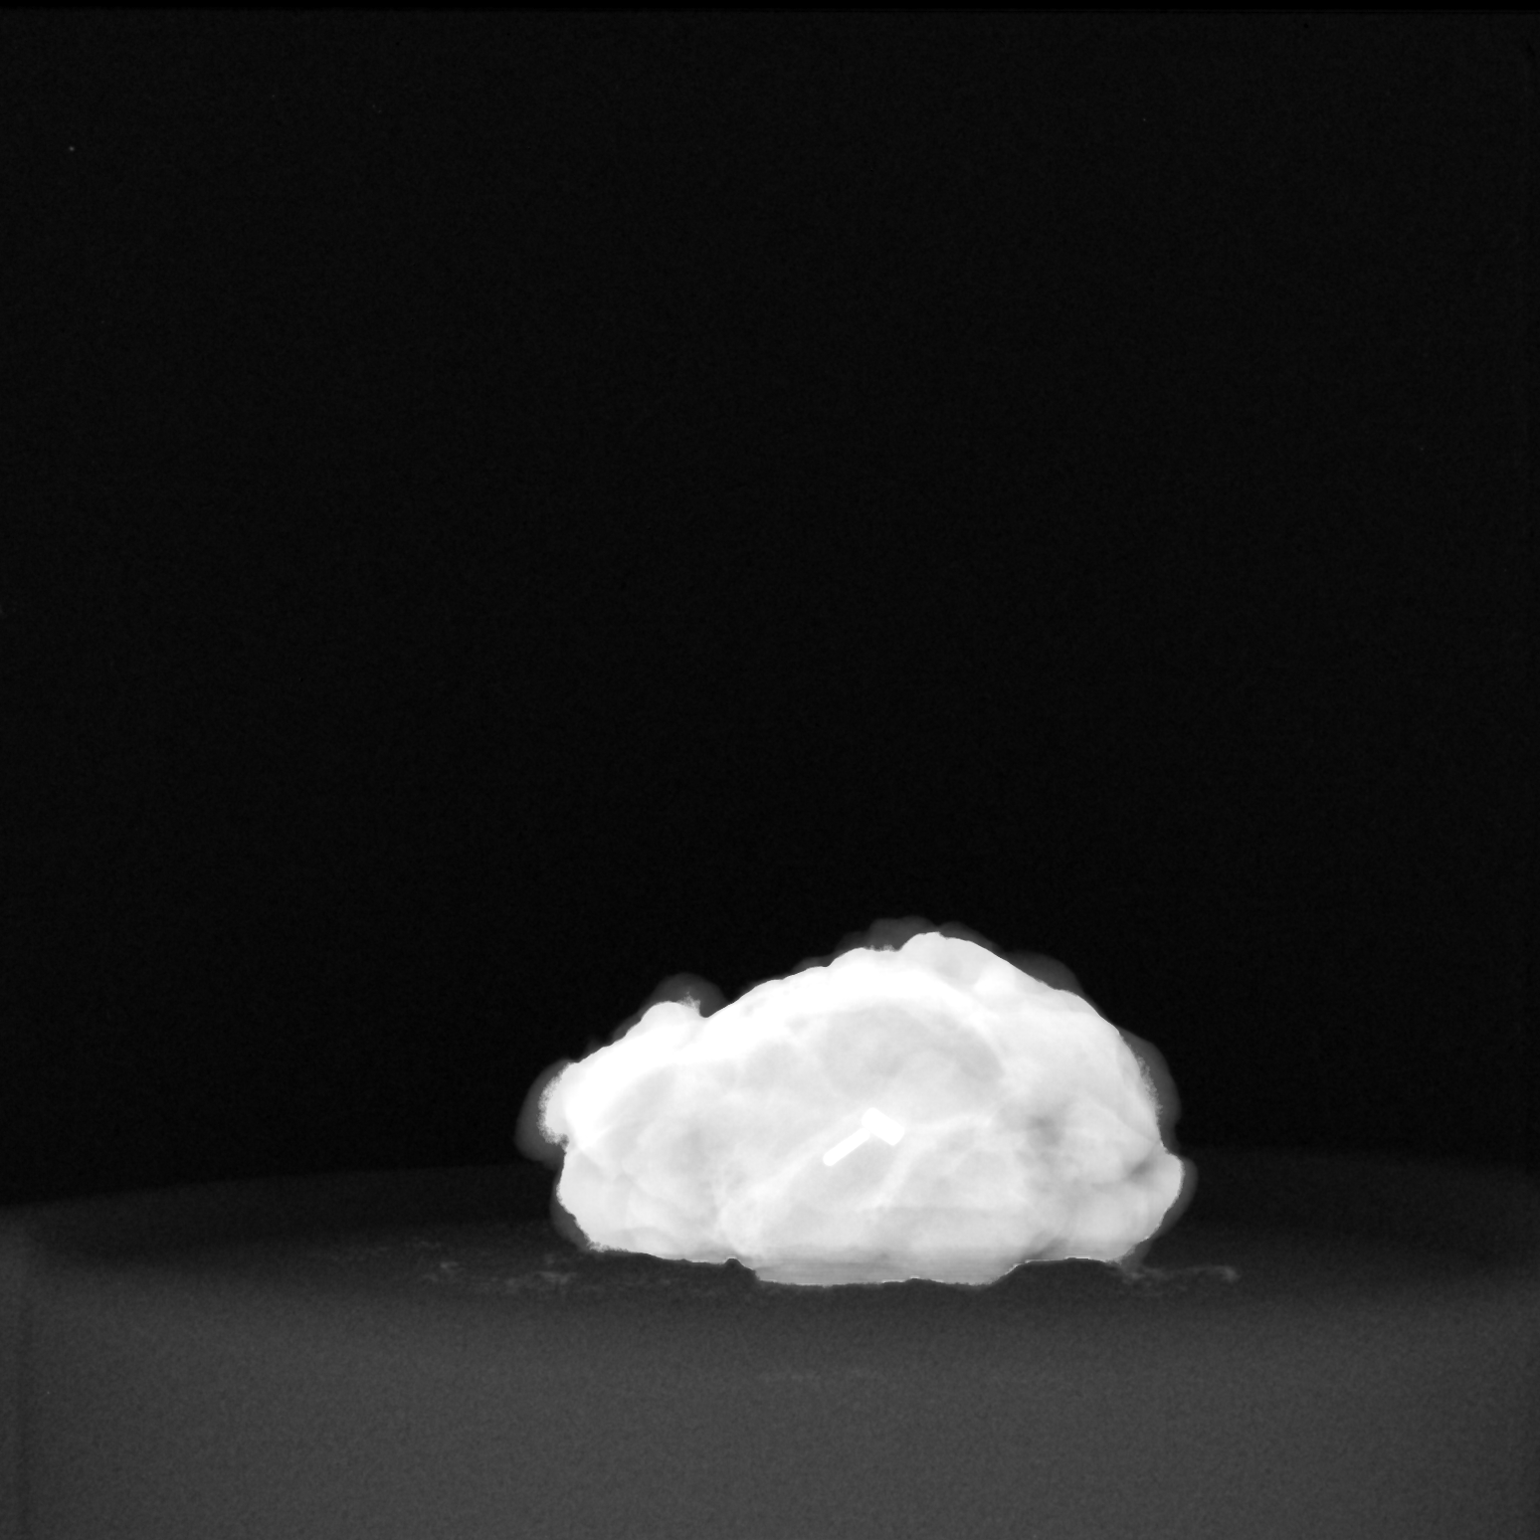
[im 2/2]
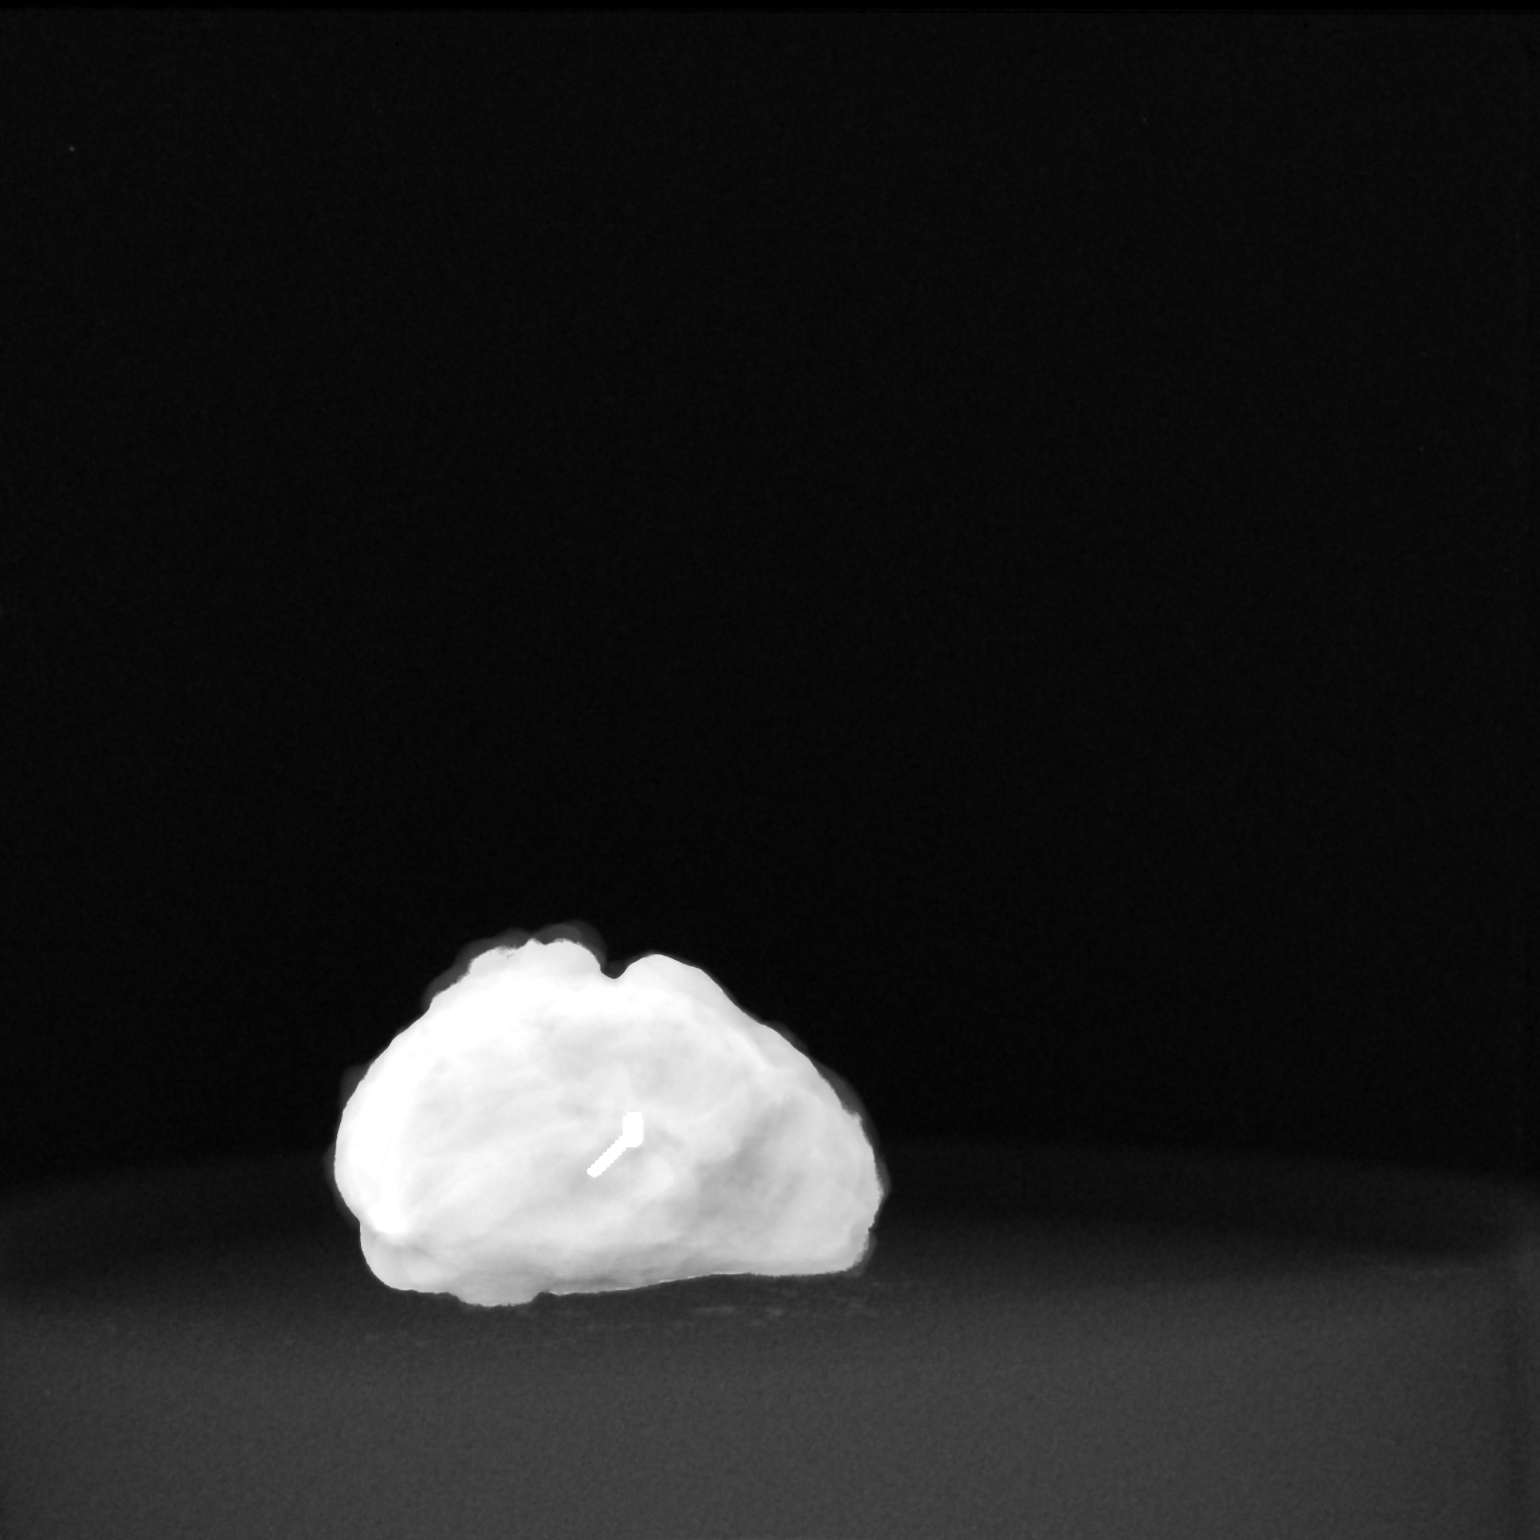

[2 of 2 positions shown; findings below may reference images not displayed]

FINDINGS: Status post excision of the right breast. The radioactive seed and
biopsy marker clip are present, completely intact, and were marked
for pathology.
IMPRESSION: Specimen radiograph of the right breast.

## 2019-11-15 IMAGING — DX MM BREAST SURGICAL SPECIMEN
1 series · 2 of 2 positions shown · non-contrast
Comparison: Previous exam(s).

CLINICAL DATA: Patient status post left breast surgical excision.

EXAM:
SPECIMEN RADIOGRAPH OF THE LEFT BREAST

[Series 2: specimen digital x-ray, derived · left · 0.10mm/px · 2 of 2 slices shown]
[im 1/2]
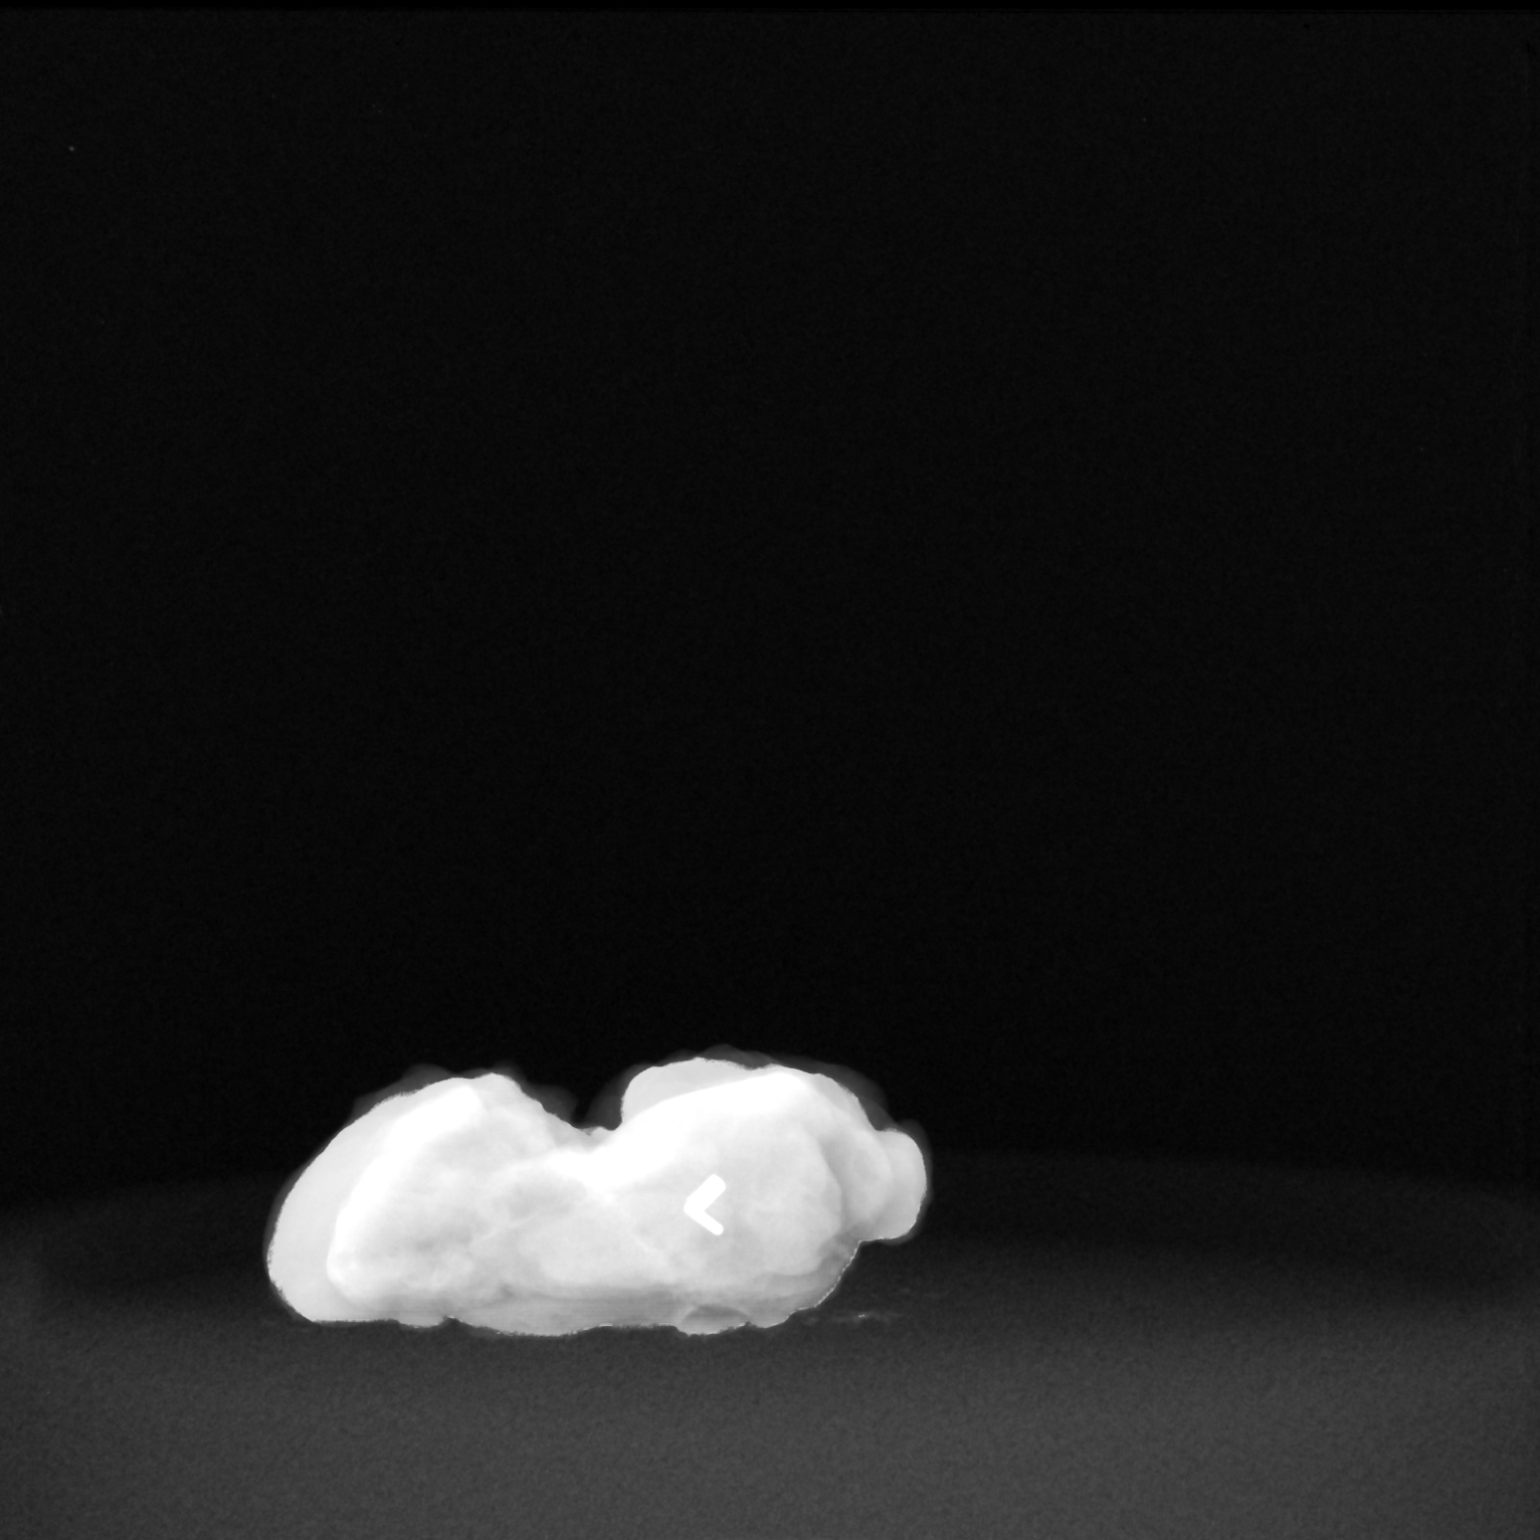
[im 2/2]
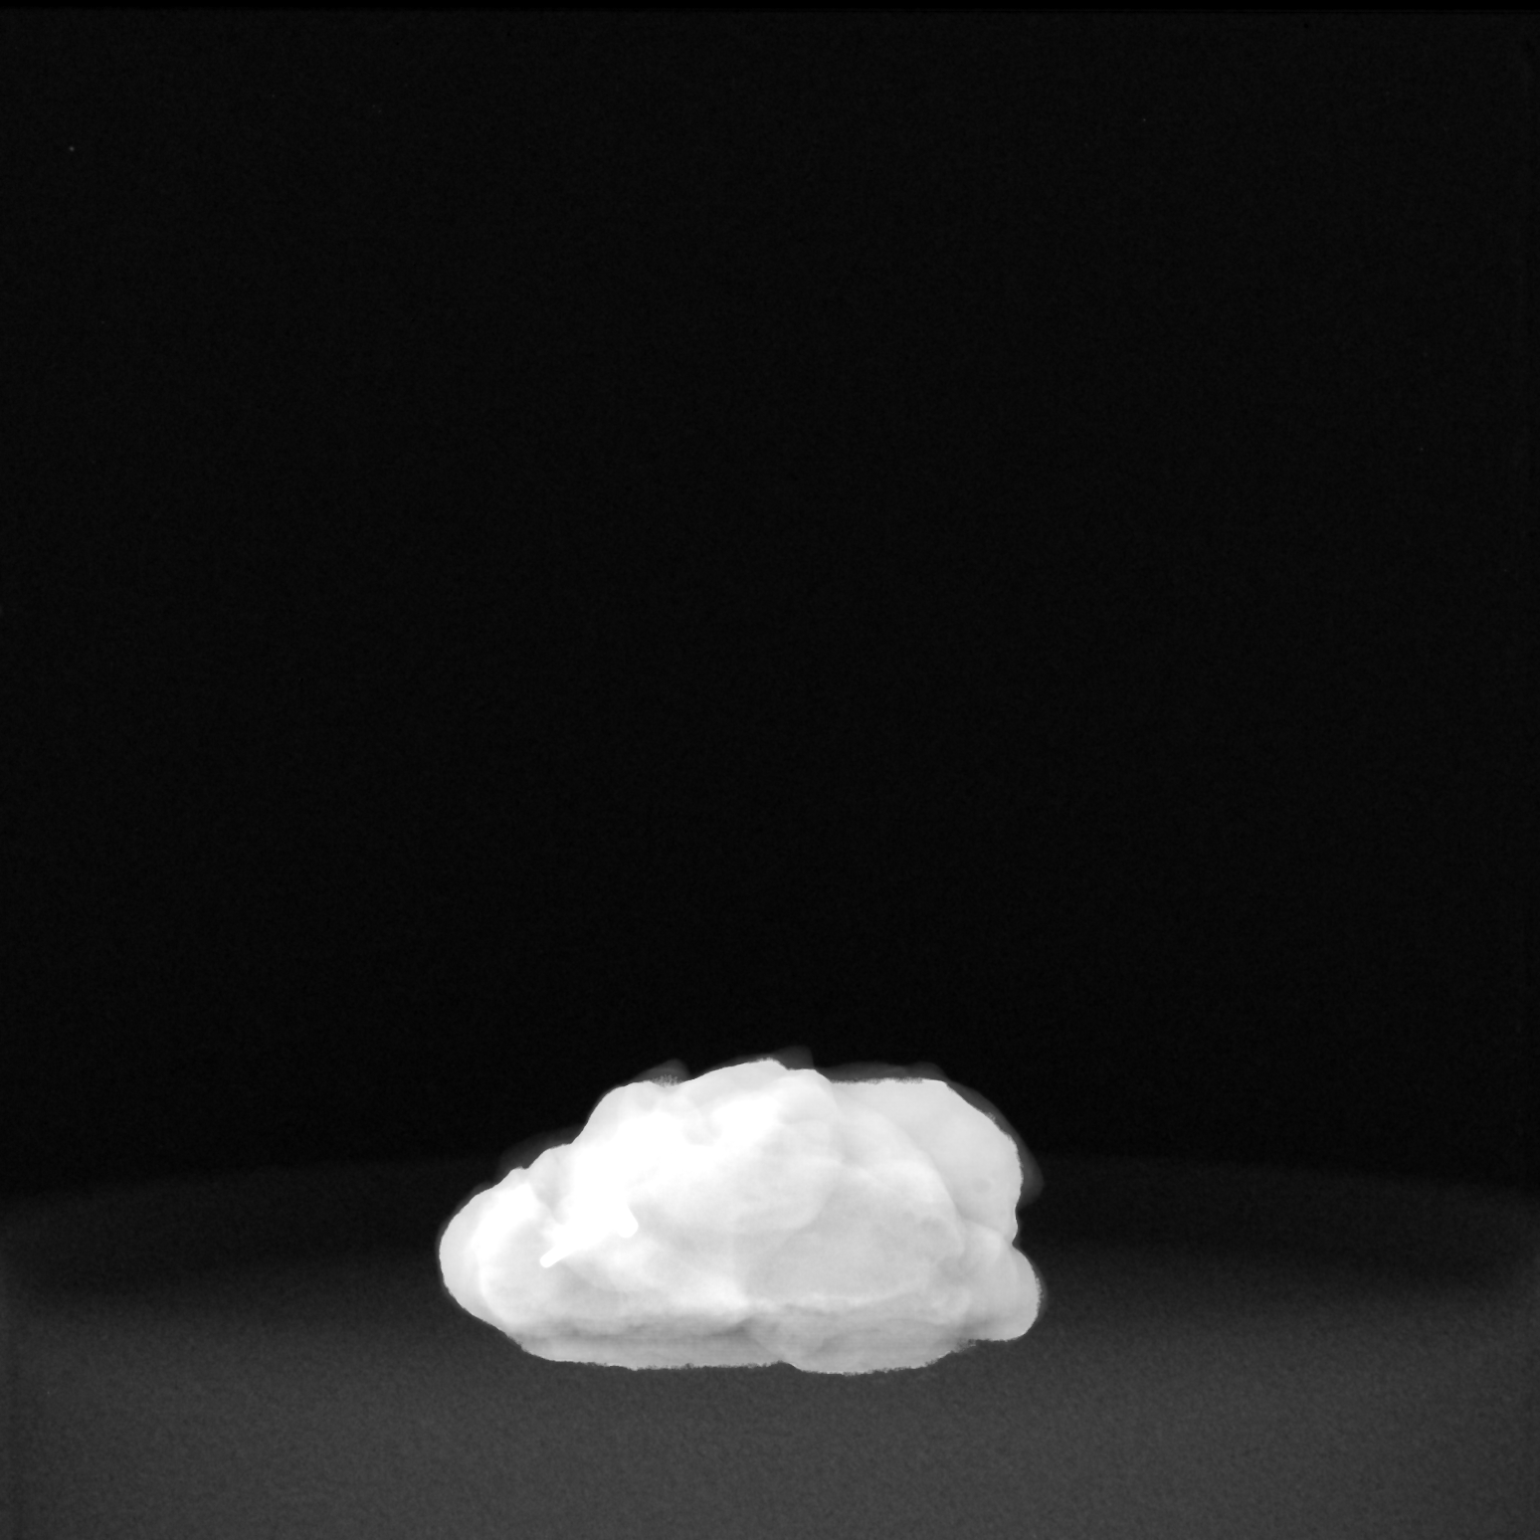

[2 of 2 positions shown; findings below may reference images not displayed]

FINDINGS: Status post excision of the left breast. The radioactive seed and
biopsy marker clip are present, completely intact, and were marked
for pathology.
IMPRESSION: Specimen radiograph of the left breast.

## 2019-11-15 SURGERY — BREAST LUMPECTOMY WITH RADIOACTIVE SEED LOCALIZATION
Anesthesia: General | Site: Breast | Laterality: Bilateral

## 2019-11-15 MED ORDER — FENTANYL CITRATE (PF) 100 MCG/2ML IJ SOLN
INTRAMUSCULAR | Status: AC
  Filled 2019-11-15: qty 2

## 2019-11-15 MED ORDER — LACTATED RINGERS IV SOLN: INTRAVENOUS | Status: DC

## 2019-11-15 MED ORDER — LIDOCAINE 2% (20 MG/ML) 5 ML SYRINGE
INTRAMUSCULAR | Status: DC | PRN
  Administered 2019-11-15: 60 mg via INTRAVENOUS

## 2019-11-15 MED ORDER — HYDROCODONE-ACETAMINOPHEN 5-325 MG PO TABS: 1.0000 | ORAL_TABLET | Freq: Four times a day (QID) | ORAL | 0 refills | Status: AC | PRN

## 2019-11-15 MED ORDER — LIDOCAINE 2% (20 MG/ML) 5 ML SYRINGE
INTRAMUSCULAR | Status: AC
  Filled 2019-11-15: qty 5

## 2019-11-15 MED ORDER — PROPOFOL 10 MG/ML IV BOLUS
INTRAVENOUS | Status: DC | PRN
  Administered 2019-11-15: 200 mg via INTRAVENOUS

## 2019-11-15 MED ORDER — FENTANYL CITRATE (PF) 100 MCG/2ML IJ SOLN: 25.0000 ug | INTRAMUSCULAR | Status: DC | PRN

## 2019-11-15 MED ORDER — PROPOFOL 500 MG/50ML IV EMUL
INTRAVENOUS | Status: AC
  Filled 2019-11-15: qty 50

## 2019-11-15 MED ORDER — FENTANYL CITRATE (PF) 100 MCG/2ML IJ SOLN
INTRAMUSCULAR | Status: DC | PRN
  Administered 2019-11-15 (×2): 25 ug via INTRAVENOUS
  Administered 2019-11-15: 50 ug via INTRAVENOUS

## 2019-11-15 MED ORDER — DEXAMETHASONE SODIUM PHOSPHATE 4 MG/ML IJ SOLN
INTRAMUSCULAR | Status: DC | PRN
  Administered 2019-11-15: 5 mg via INTRAVENOUS

## 2019-11-15 MED ORDER — CELECOXIB 200 MG PO CAPS
200.0000 mg | ORAL_CAPSULE | ORAL | Status: AC
  Administered 2019-11-15: 200 mg via ORAL

## 2019-11-15 MED ORDER — ONDANSETRON HCL 4 MG/2ML IJ SOLN
INTRAMUSCULAR | Status: DC | PRN
  Administered 2019-11-15: 4 mg via INTRAVENOUS

## 2019-11-15 MED ORDER — CHLORHEXIDINE GLUCONATE CLOTH 2 % EX PADS: 6.0000 | MEDICATED_PAD | Freq: Once | CUTANEOUS | Status: DC

## 2019-11-15 MED ORDER — MIDAZOLAM HCL 2 MG/2ML IJ SOLN
INTRAMUSCULAR | Status: AC
  Filled 2019-11-15: qty 2

## 2019-11-15 MED ORDER — CLINDAMYCIN PHOSPHATE 900 MG/50ML IV SOLN
INTRAVENOUS | Status: AC
  Filled 2019-11-15: qty 50

## 2019-11-15 MED ORDER — ACETAMINOPHEN 500 MG PO TABS
ORAL_TABLET | ORAL | Status: AC
  Filled 2019-11-15: qty 2

## 2019-11-15 MED ORDER — GABAPENTIN 300 MG PO CAPS
ORAL_CAPSULE | ORAL | Status: AC
  Filled 2019-11-15: qty 1

## 2019-11-15 MED ORDER — EPHEDRINE SULFATE 50 MG/ML IJ SOLN
INTRAMUSCULAR | Status: DC | PRN
  Administered 2019-11-15: 10 mg via INTRAVENOUS

## 2019-11-15 MED ORDER — BUPIVACAINE HCL (PF) 0.25 % IJ SOLN
INTRAMUSCULAR | Status: DC | PRN
  Administered 2019-11-15: 30 mL

## 2019-11-15 MED ORDER — CLINDAMYCIN PHOSPHATE 900 MG/50ML IV SOLN
900.0000 mg | INTRAVENOUS | Status: AC
  Administered 2019-11-15: 900 mg via INTRAVENOUS

## 2019-11-15 MED ORDER — MIDAZOLAM HCL 5 MG/5ML IJ SOLN
INTRAMUSCULAR | Status: DC | PRN
  Administered 2019-11-15: 2 mg via INTRAVENOUS

## 2019-11-15 MED ORDER — CELECOXIB 200 MG PO CAPS
ORAL_CAPSULE | ORAL | Status: AC
  Filled 2019-11-15: qty 1

## 2019-11-15 MED ORDER — ONDANSETRON HCL 4 MG/2ML IJ SOLN
INTRAMUSCULAR | Status: AC
  Filled 2019-11-15: qty 2

## 2019-11-15 MED ORDER — BUPIVACAINE HCL (PF) 0.25 % IJ SOLN
INTRAMUSCULAR | Status: AC
  Filled 2019-11-15: qty 30

## 2019-11-15 MED ORDER — ACETAMINOPHEN 500 MG PO TABS
1000.0000 mg | ORAL_TABLET | ORAL | Status: AC
  Administered 2019-11-15: 1000 mg via ORAL

## 2019-11-15 MED ORDER — GABAPENTIN 300 MG PO CAPS
300.0000 mg | ORAL_CAPSULE | ORAL | Status: AC
  Administered 2019-11-15: 300 mg via ORAL

## 2019-11-15 MED ORDER — DEXAMETHASONE SODIUM PHOSPHATE 10 MG/ML IJ SOLN
INTRAMUSCULAR | Status: AC
  Filled 2019-11-15: qty 1

## 2019-11-15 MED ORDER — IBUPROFEN 800 MG PO TABS: 800.0000 mg | ORAL_TABLET | Freq: Three times a day (TID) | ORAL | 0 refills | Status: AC | PRN

## 2019-11-15 SURGICAL SUPPLY — 51 items
APPLIER CLIP 9.375 MED OPEN (MISCELLANEOUS)
BINDER BREAST LRG (GAUZE/BANDAGES/DRESSINGS) IMPLANT
BINDER BREAST MEDIUM (GAUZE/BANDAGES/DRESSINGS) IMPLANT
BINDER BREAST XLRG (GAUZE/BANDAGES/DRESSINGS) IMPLANT
BINDER BREAST XXLRG (GAUZE/BANDAGES/DRESSINGS) ×1 IMPLANT
BLADE SURG 15 STRL LF DISP TIS (BLADE) ×1 IMPLANT
BLADE SURG 15 STRL SS (BLADE) ×1
CANISTER SUC SOCK COL 7IN (MISCELLANEOUS) IMPLANT
CANISTER SUCT 1200ML W/VALVE (MISCELLANEOUS) IMPLANT
CHLORAPREP W/TINT 26 (MISCELLANEOUS) ×3 IMPLANT
CLIP APPLIE 9.375 MED OPEN (MISCELLANEOUS) IMPLANT
COVER BACK TABLE 60X90IN (DRAPES) ×2 IMPLANT
COVER MAYO STAND STRL (DRAPES) ×2 IMPLANT
COVER PROBE W GEL 5X96 (DRAPES) ×2 IMPLANT
COVER WAND RF STERILE (DRAPES) IMPLANT
DECANTER SPIKE VIAL GLASS SM (MISCELLANEOUS) IMPLANT
DERMABOND ADVANCED (GAUZE/BANDAGES/DRESSINGS) ×2
DERMABOND ADVANCED .7 DNX12 (GAUZE/BANDAGES/DRESSINGS) ×1 IMPLANT
DRAPE LAPAROSCOPIC ABDOMINAL (DRAPES) ×1 IMPLANT
DRAPE LAPAROTOMY 100X72 PEDS (DRAPES) ×1 IMPLANT
DRAPE UTILITY XL STRL (DRAPES) ×2 IMPLANT
ELECT COATED BLADE 2.86 ST (ELECTRODE) ×2 IMPLANT
ELECT REM PT RETURN 9FT ADLT (ELECTROSURGICAL) ×2
ELECTRODE REM PT RTRN 9FT ADLT (ELECTROSURGICAL) ×1 IMPLANT
GLOVE BIO SURGEON STRL SZ 6.5 (GLOVE) ×2 IMPLANT
GLOVE BIOGEL PI IND STRL 7.0 (GLOVE) IMPLANT
GLOVE BIOGEL PI IND STRL 8 (GLOVE) ×1 IMPLANT
GLOVE BIOGEL PI INDICATOR 7.0 (GLOVE) ×2
GLOVE BIOGEL PI INDICATOR 8 (GLOVE) ×1
GLOVE ECLIPSE 8.0 STRL XLNG CF (GLOVE) ×3 IMPLANT
GLOVE SURG SS PI 6.5 STRL IVOR (GLOVE) ×2 IMPLANT
GOWN STRL REUS W/ TWL LRG LVL3 (GOWN DISPOSABLE) ×2 IMPLANT
GOWN STRL REUS W/TWL LRG LVL3 (GOWN DISPOSABLE) ×3
HEMOSTAT ARISTA ABSORB 3G PWDR (HEMOSTASIS) IMPLANT
HEMOSTAT SNOW SURGICEL 2X4 (HEMOSTASIS) IMPLANT
KIT MARKER MARGIN INK (KITS) ×2 IMPLANT
NDL HYPO 25X1 1.5 SAFETY (NEEDLE) ×1 IMPLANT
NEEDLE HYPO 25X1 1.5 SAFETY (NEEDLE) ×2 IMPLANT
NS IRRIG 1000ML POUR BTL (IV SOLUTION) ×2 IMPLANT
PACK BASIN DAY SURGERY FS (CUSTOM PROCEDURE TRAY) ×2 IMPLANT
PENCIL SMOKE EVACUATOR (MISCELLANEOUS) ×2 IMPLANT
SLEEVE SCD COMPRESS KNEE MED (MISCELLANEOUS) ×2 IMPLANT
SPONGE LAP 4X18 RFD (DISPOSABLE) ×3 IMPLANT
SUT MNCRL AB 4-0 PS2 18 (SUTURE) ×3 IMPLANT
SUT SILK 2 0 SH (SUTURE) IMPLANT
SUT VICRYL 3-0 CR8 SH (SUTURE) ×3 IMPLANT
SYR CONTROL 10ML LL (SYRINGE) ×2 IMPLANT
TOWEL GREEN STERILE FF (TOWEL DISPOSABLE) ×2 IMPLANT
TRAY FAXITRON CT DISP (TRAY / TRAY PROCEDURE) ×3 IMPLANT
TUBE CONNECTING 20X1/4 (TUBING) ×1 IMPLANT
YANKAUER SUCT BULB TIP NO VENT (SUCTIONS) ×1 IMPLANT

## 2019-11-15 NOTE — H&P (Signed)
Amanda Rasmussen   Location: [facility]  Patient #: [id number]  DOB: [birth year]  Single / Language: Amanda Rasmussen / Race: White  Female  History of Present Illness   Patient words: Patient sent at the request of Dr. Jeanmarie Plant of the [facility] [place] due to abnormal mammogram. Patient underwent bilateral screening mammograms with subsequent diagnostic mammograms which showed 4 areas of abnormal enhancement to each breast. She underwent subsequent magnetic resonance imaging and core biopsy of 2-4 areas one on each side which showed radial scar with atypia. She has 4 first for relatives with breast cancer 2 on each side. She underwent genetic testing she stated last year by her gynecologist which was "negative". She also has another with prostate cancer. She has no complaints except for bruising in both breasts.  CLINICAL DATA: 39 year old female with a very strong family history  of breast cancer presenting for high risk screening MRI.  LABS: None performed on site.  EXAM:  BILATERAL BREAST MRI WITH AND WITHOUT CONTRAST  TECHNIQUE:  Multiplanar, multisequence MR images of both breasts were obtained  prior to and following the intravenous administration of 9 ml of  Gadavist.  Three-dimensional MR images were rendered by post-processing of the  original MR data on an independent workstation. The  three-dimensional MR images were interpreted, and findings are  reported in the following complete MRI report for this study. Three  dimensional images were evaluated at the independent DynaCad  workstation  COMPARISON: Previous exam(s).  FINDINGS:  Breast composition: b. Scattered fibroglandular tissue.  Background parenchymal enhancement: Moderate.  Right breast: There are several 3-4 mm enhancing foci scattered  throughout the right breast. A slightly irregular 4 mm focus without  associated T2 correlate is seen in the upper outer quadrant  anteriorly (series 7, image 54/112). A 5  mm focus is seen in the  superior central aspect anteriorly (series 7, image 56/112).  Additional foci are noted as marked on PACS series 7.  Left breast: Suspicious non mass enhancement is identified in the  upper outer quadrant extending from mid to anterior depth (series 7,  images 70-79/112). It spans 5 x 1.2 x 0.9 cm (AP by transverse by  craniocaudal dimensions). No other dominant mass or suspicious  enhancement.  Lymph nodes: No abnormal appearing lymph nodes.  Ancillary findings: None.  IMPRESSION:  1. Suspicious non-mass enhancement in the upper outer left breast  spanning from mid to anterior depth by approximately 5 cm.  Recommendation is for MRI guided biopsy along the anterior and  posterior aspects.  2. Several indeterminate 3-4 mm enhancing foci scattered throughout  the right breast. A 4 mm focus in the upper outer quadrant (series  7, image 54/112) demonstrates a slightly irregular shape without T2  correlate. Recommendation is that the patient return on a separate  day for 2 area MRI guided biopsy of the right breast to include this  4 mm focus as well as 1 additional focus at the discretion of the  performing radiologist.  3. No suspicious lymphadenopathy.  RECOMMENDATION:  A total of 4 MRI guided biopsies are recommended. The should be  performed on 2 separate days, with the patient returning first for a  2 area biopsy on the left and subsequently for a 2 area biopsy on  the right.  BI-RADS CATEGORY 4: Suspicious.  Electronically Signed  By: Amanda Rasmussen M.D.  On: 08/27/2019 12:29  Diagnosis  1. Breast, left, needle core biopsy, anterior aspect 5cm NME UOQ  -  COLUMNAR CELL AND FIBROCYSTIC CHANGES WITH APOCRINE METAPLASIA  - PSEUDOANGIOMATOUS STROMAL HYPERPLASIA  - CALCIFICATIONS  - NO MALIGNANCY IDENTIFIED  2. Breast, left, needle core biopsy, posterior aspect 5cm NME UOQ (mid)  - COMPLEX SCLEROSING LESION WITH ATYPIA  - SEE COMMENT  Diagnosis  1.  Breast, right, needle core biopsy, superior  - FIBROCYSTIC CHANGE. DUCT EPITHELIAL HYPERPLASIA. PSEUDOANGIOMATOUS STROMAL HYPERPLASIA ([place]).  2. Breast, right, needle core biopsy, upper outer  - COMPLEX SCLEROSING LESION.  The patient is a 39 year old female.  Past Surgical History [facility] Teressa Rasmussen, [place]; 10/01/2019 11:25 AM)  Breast Biopsy Bilateral.  Gallbladder Surgery - Open  Liver Surgery  Diagnostic Studies History (Amanda Rasmussen, CMA; 10/01/2019 11:25 AM)  Colonoscopy 1-5 years ago  Mammogram within last year  Pap Smear 1-5 years ago  Allergies Emmaline Kluver Teressa Rasmussen, CMA; 10/01/2019 11:26 AM)  Penicillin G Benzathine & Proc *PENICILLINS*  Allergies Reconciled  Medication History (Amanda Rasmussen, CMA; 10/01/2019 11:26 AM)  Desvenlafaxine Succinate ER (50MG  Tablet ER 24HR, Oral) Active.  Medications Reconciled  Social History (Amanda Rasmussen, CMA; 10/01/2019 11:25 AM)  Caffeine use Carbonated beverages, Coffee, Tea.  No alcohol use  No drug use  Tobacco use Former smoker.  Family History  Arthritis Father.  Breast Cancer Family Members In General.  Depression Father, Mother, Sister.  Diabetes Mellitus Father.  Thyroid problems Mother.  Pregnancy / Birth History  Age at menarche 39 years.  Contraceptive History Depo-provera, Oral contraceptives.  Gravida 0  Irregular periods  Para 0  Other Problems  Anxiety Disorder  Arthritis  Depression  Gastroesophageal Reflux Disease  Heart murmur  Lump In Breast  Review of Systems General Not Present- Appetite Loss, Chills, Fatigue, Fever, Night Sweats, Weight Gain and Weight Loss.  Skin Not Present- Change in Wart/Mole, Dryness, Hives, Jaundice, New Lesions, Non-Healing Wounds, Rash and Ulcer.  HEENT Present- Seasonal Allergies. Not Present- Earache, Hearing Loss, Hoarseness, Nose Bleed, Oral Ulcers, Ringing in the Ears, Sinus Pain, Sore Throat, Visual Disturbances, Wears glasses/contact lenses and Yellow Eyes.  Respiratory Present- Snoring. Not  Present- Bloody sputum, Chronic Cough, Difficulty Breathing and Wheezing.  Breast Present- Breast Mass. Not Present- Breast Pain, Nipple Discharge and Skin Changes.  Cardiovascular Not Present- Chest Pain, Difficulty Breathing Lying Down, Leg Cramps, Palpitations, Rapid Heart Rate, Shortness of Breath and Swelling of Extremities.  Gastrointestinal Present- Chronic diarrhea. Not Present- Abdominal Pain, Bloating, Bloody Stool, Change in Bowel Habits, Constipation, Difficulty Swallowing, Excessive gas, Gets full quickly at meals, Hemorrhoids, Indigestion, Nausea, Rectal Pain and Vomiting.  Female Genitourinary Not Present- Frequency, Nocturia, Painful Urination, Pelvic Pain and Urgency.  Musculoskeletal Not Present- Back Pain, Joint Pain, Joint Stiffness, Muscle Pain, Muscle Weakness and Swelling of Extremities.  Neurological Not Present- Decreased Memory, Fainting, Headaches, Numbness, Seizures, Tingling, Tremor, Trouble walking and Weakness.  Psychiatric Present- Anxiety and Depression. Not Present- Bipolar, Change in Sleep Pattern, Fearful and Frequent crying.  Hematology Present- Easy Bruising. Not Present- Blood Thinners, Excessive bleeding, Gland problems, HIV and Persistent Infections.  Vitals  10/01/2019 11:26 AM  Weight: 212.13 lb Height: 60 in  Body Surface Area: 1.91 m Body Mass Index: 41.43 kg/m  Temp.: 97.1 F Pulse: 92 (Regular)  BP: 116/78 (Sitting, Left Arm, Standard)  Physical Exam  General  Mental Status - Alert.  General Appearance - Consistent with stated age.  Hydration - Well hydrated.  Voice - Normal.  Head and Neck  Head - normocephalic, atraumatic with no lesions or palpable masses.  Trachea - midline.  Thyroid  Gland Characteristics - normal size and consistency.  Chest and Lung Exam  Note: wob normal  Breast  Breast - Left - Symmetric, Non Tender, No Biopsy scars, no Dimpling - Left, No Inflammation, No Lumpectomy scars, No Mastectomy scars, No Peau d' Orange.   Breast - Right - Symmetric, Non Tender, No Biopsy scars, no Dimpling - Right, No Inflammation, No Lumpectomy scars, No Mastectomy scars, No Peau d' Orange.  Breast Lump - No Palpable Breast Mass.  Cardiovascular  Note: NSR  Neurologic  Neurologic evaluation reveals - alert and oriented x 3 with no impairment of recent or remote memory.  Mental Status - Normal.  Musculoskeletal  Normal Exam - Left - Upper Extremity Strength Normal and Lower Extremity Strength Normal.  Normal Exam - Right - Upper Extremity Strength Normal and Lower Extremity Strength Normal.  Lymphatic  Head & Neck  General Head & Neck Lymphatics: Bilateral - Description - Normal.  Axillary  General Axillary Region: Bilateral - Description - Normal. Tenderness - Non Tender.  Assessment & Plan (Klein Willcox A. 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 Pt agrees to proceed.  45 minutes total time spent counseling, face-to-face, reviewing chart, reviewing mammograms, reviewing pathology report, reviewing also states, and documentation  RADIAL SCAR OF RIGHT BREAST (N64.89)  Current Plans  You are being scheduled for surgery - Our schedulers will call you.  You should hear from our office's scheduling department within 5 working days about the location, date, and time of surgery. We try to make accommodations for patient's preferences in scheduling surgery, but sometimes the OR schedule or the surgeon's schedule prevents [place] from making those accommodations.  If you have not heard from our office [phone]) in 5 working days, call the office and ask for your surgeon's nurse.  If you have other questions about your diagnosis, plan,  or surgery, call the office and ask for your surgeon's nurse.  Pt Education - [facility] Breast Biopsy HCI: discussed with patient and provided information.  AT HIGH RISK FOR BREAST CANCER (Z91.89)  Impression: lifetime risk 23.6 %  Discussed risks prevent extremities to include diet, proper BMI maintenance, exercise, alcohol cessation, medications surgery. She has undergone genetic testing last year which was negative  She will send me a copy of this to make shorts up-to-date.

## 2019-11-15 NOTE — Anesthesia Preprocedure Evaluation (Addendum)
Anesthesia Evaluation  Patient identified by MRN, date of birth, ID band Patient awake    Reviewed: Allergy & Precautions, NPO status , Patient's Chart, lab work & pertinent test results  Airway Mallampati: II  TM Distance: >3 FB Neck ROM: Full    Dental no notable dental hx. (+) Teeth Intact, Dental Advisory Given   Pulmonary neg pulmonary ROS,    Pulmonary exam normal breath sounds clear to auscultation       Cardiovascular negative cardio ROS Normal cardiovascular exam Rhythm:Regular Rate:Normal     Neuro/Psych PSYCHIATRIC DISORDERS Anxiety Depression negative neurological ROS     GI/Hepatic negative GI ROS, Neg liver ROS,   Endo/Other  Morbid obesity (BMI 41)PCOS  Renal/GU negative Renal ROS  negative genitourinary   Musculoskeletal negative musculoskeletal ROS (+)   Abdominal   Peds  Hematology negative hematology ROS (+)   Anesthesia Other Findings   Reproductive/Obstetrics                             Anesthesia Physical Anesthesia Plan  ASA: III  Anesthesia Plan: General   Post-op Pain Management:    Induction: Intravenous  PONV Risk Score and Plan: 3 and Ondansetron, Dexamethasone and Midazolam  Airway Management Planned: LMA  Additional Equipment:   Intra-op Plan:   Post-operative Plan: Extubation in OR  Informed Consent: I have reviewed the patients History and Physical, chart, labs and discussed the procedure including the risks, benefits and alternatives for the proposed anesthesia with the patient or authorized representative who has indicated his/her understanding and acceptance.     Dental advisory given  Plan Discussed with: CRNA  Anesthesia Plan Comments:         Anesthesia Quick Evaluation

## 2019-11-15 NOTE — Anesthesia Procedure Notes (Signed)
Procedure Name: LMA Insertion Date/Time: 11/15/2019 8:15 AM Performed by: Maryella Shivers, CRNA Pre-anesthesia Checklist: Patient identified, Emergency Drugs available, Suction available and Patient being monitored Patient Re-evaluated:Patient Re-evaluated prior to induction Oxygen Delivery Method: Circle system utilized Preoxygenation: Pre-oxygenation with 100% oxygen Induction Type: IV induction Ventilation: Mask ventilation without difficulty LMA: LMA inserted LMA Size: 4.0 Number of attempts: 1 Airway Equipment and Method: Bite block Placement Confirmation: positive ETCO2 Tube secured with: Tape Dental Injury: Teeth and Oropharynx as per pre-operative assessment

## 2019-11-15 NOTE — Op Note (Signed)
Preoperative diagnosis: Bilateral breast radial scar  Postoperative diagnosis: Same  Procedure: Bilateral seed localized lumpectomy  Surgeon: Erroll Luna, MD  Anesthesia: LMA with 0.25% Marcaine local  EBL: Minimal  Specimen: Left breast tissue with seed and clip verified by Faxitron and right breast tissue with seed and clip verified by Faxitron boosted pathology after orientation  Drains: None  IV fluids: Per anesthesia record  Indications for procedure: The patient is a 39 year old female who underwent core biopsy of 2 suspicious lesions 1 in each breast.  Both showed radial scar.  She is a strong family history history of breast cancer and the left-sided lesion showed some potential atypia.  We discussed options of lumpectomy.  She is a high risk patient we had discussion about high risk lesions and high risk management as well.  She opted to proceed with bilateral lumpectomy given family history.  We discussed potential upgrade risk as well.The procedure has been discussed with the patient. Alternatives to surgery have been discussed with the patient.  Risks of surgery include bleeding,  Infection,  Seroma formation, death,  and the need for further surgery.   The patient understands and wishes to proceed.   Description of procedure: The patient was met in the holding area and questions were answered.  Neoprobe used to verify seed location in both breast.  Films available for review.  She was then taken back to the operating room.  She is placed supine upon the OR table.  After induction of general esthesia, both breasts were prepped and draped in sterile fashion and timeout was performed.  She received appropriate preoperative antibiotics.  Left side was done first.  Neoprobe used to identify the seed left breast upper outer quadrant.  Curvilinear incision was made and dissection was carried down with the help of the neoprobe to excise all tissue around both the seed and clip.  Faxitron  image revealed both seed and clip to be present.  Of note this was oriented with ink and sent to pathology after this.  This was irrigated.  Local anesthetic infiltrated.  This was closed with 3-0 Vicryl for Monocryl.  In a similar fashion the neoprobe was used to identify the seed and clip right breast.  This was also in the upper outer quadrant.  Curvilinear incision was made.  Dissection carried down around all tissue to excise the seed and clip with a grossly negative margin.  Hemostasis achieved with cautery.  Local anesthetic infiltrated.  Wound closed with 3-0 Vicryl for Monocryl.  Dermabond applied to both.  Breast binder in place.  All counts were found to be correct.  The patient was awoke extubated taken to recovery in satisfactory condition.

## 2019-11-15 NOTE — Discharge Instructions (Signed)
No Tylenol before 1:30PM today.  Post Anesthesia Home Care Instructions  Activity: Get plenty of rest for the remainder of the day. A responsible individual must stay with you for 24 hours following the procedure.  For the next 24 hours, DO NOT: -Drive a car -Paediatric nurse -Drink alcoholic beverages -Take any medication unless instructed by your physician -Make any legal decisions or sign important papers.  Meals: Start with liquid foods such as gelatin or soup. Progress to regular foods as tolerated. Avoid greasy, spicy, heavy foods. If nausea and/or vomiting occur, drink only clear liquids until the nausea and/or vomiting subsides. Call your physician if vomiting continues.  Special Instructions/Symptoms: Your throat may feel dry or sore from the anesthesia or the breathing tube placed in your throat during surgery. If this causes discomfort, gargle with warm salt water. The discomfort should disappear within 24 hours.  If you had a scopolamine patch placed behind your ear for the management of post- operative nausea and/or vomiting:  1. The medication in the patch is effective for 72 hours, after which it should be removed.  Wrap patch in a tissue and discard in the trash. Wash hands thoroughly with soap and water. 2. You may remove the patch earlier than 72 hours if you experience unpleasant side effects which may include dry mouth, dizziness or visual disturbances. 3. Avoid touching the patch. Wash your hands with soap and water after contact with the patch.    [place] Office Phone Number [phone]  BREAST BIOPSY/ PARTIAL MASTECTOMY: POST OP INSTRUCTIONS  Always review your discharge instruction sheet given to you by the facility where your surgery was performed.  IF YOU HAVE DISABILITY OR FAMILY LEAVE FORMS, YOU MUST BRING THEM TO THE OFFICE FOR PROCESSING.  DO NOT GIVE THEM TO YOUR DOCTOR.  1. A prescription for pain medication may be given to you  upon discharge.  Take your pain medication as prescribed, if needed.  If narcotic pain medicine is not needed, then you may take acetaminophen (Tylenol) or ibuprofen (Advil) as needed. 2. Take your usually prescribed medications unless otherwise directed 3. If you need a refill on your pain medication, please contact your pharmacy.  They will contact our office to request authorization.  Prescriptions will not be filled after 5pm or on week-ends. 4. You should eat very light the first 24 hours after surgery, such as soup, crackers, pudding, etc.  Resume your normal diet the day after surgery. 5. Most patients will experience some swelling and bruising in the breast.  Ice packs and a good support bra will help.  Swelling and bruising can take several days to resolve.  6. It is common to experience some constipation if taking pain medication after surgery.  Increasing fluid intake and taking a stool softener will usually help or prevent this problem from occurring.  A mild laxative (Milk of Magnesia or Miralax) should be taken according to package directions if there are no bowel movements after 48 hours. 7. Unless discharge instructions indicate otherwise, you may remove your bandages 24-48 hours after surgery, and you may shower at that time.  You may have steri-strips (small skin tapes) in place directly over the incision.  These strips should be left on the skin for 7-10 days.  If your surgeon used skin glue on the incision, you may shower in 24 hours.  The glue will flake off over the next 2-3 weeks.  Any sutures or staples will be removed at the office during  your follow-up visit. 8. ACTIVITIES:  You may resume regular daily activities (gradually increasing) beginning the next day.  Wearing a good support bra or sports bra minimizes pain and swelling.  You may have sexual intercourse when it is comfortable. a. You may drive when you no longer are taking prescription pain medication, you can comfortably  wear a seatbelt, and you can safely maneuver your car and apply brakes. b. RETURN TO WORK:  ______________________________________________________________________________________ 9. You should see your doctor in the office for a follow-up appointment approximately two weeks after your surgery.  Your doctor's nurse will typically make your follow-up appointment when she calls you with your pathology report.  Expect your pathology report 2-3 business days after your surgery.  You may call to check if you do not hear from [place] after three days. 10. OTHER INSTRUCTIONS: _______________________________________________________________________________________________ _____________________________________________________________________________________________________________________________________ _____________________________________________________________________________________________________________________________________ _____________________________________________________________________________________________________________________________________  WHEN TO CALL YOUR DOCTOR: 1. Fever over 101.0 2. Nausea and/or vomiting. 3. Extreme swelling or bruising. 4. Continued bleeding from incision. 5. Increased pain, redness, or drainage from the incision.  The clinic staff is available to answer your questions during regular business hours.  Please don't hesitate to call and ask to speak to one of the nurses for clinical concerns.  If you have a medical emergency, go to the nearest emergency room or call 911.  A surgeon from [facility] is always on call at the hospital.  For further questions, please visit [email or web address]

## 2019-11-15 NOTE — Transfer of Care (Signed)
Immediate Anesthesia Transfer of Care Note  Patient: Amanda Rasmussen  Procedure(s) Performed: BILATERAL BREAST LUMPECTOMY WITH RADIOACTIVE SEED LOCALIZATION (Bilateral Breast)  Patient Location: PACU  Anesthesia Type:General  Level of Consciousness: sedated  Airway & Oxygen Therapy: Patient Spontanous Breathing and Patient connected to face mask oxygen  Post-op Assessment: Report given to RN and Post -op Vital signs reviewed and stable  Post vital signs: Reviewed and stable  Last Vitals:  Vitals Value Taken Time  BP 111/62 11/15/19 0912  Temp    Pulse 101 11/15/19 0913  Resp 21 11/15/19 0913  SpO2 100 % 11/15/19 0913  Vitals shown include unvalidated device data.  Last Pain:  Vitals:   11/15/19 0736  TempSrc: Tympanic  PainSc: 0-No pain      Patients Stated Pain Goal: 6 ([id number])  Complications: No apparent anesthesia complications

## 2019-11-15 NOTE — Anesthesia Postprocedure Evaluation (Signed)
Anesthesia Post Note  Patient: Andressa Vester Ramthun  Procedure(s) Performed: BILATERAL BREAST LUMPECTOMY WITH RADIOACTIVE SEED LOCALIZATION (Bilateral Breast)     Patient location during evaluation: PACU Anesthesia Type: General Level of consciousness: awake and alert Pain management: pain level controlled Vital Signs Assessment: post-procedure vital signs reviewed and stable Respiratory status: spontaneous breathing, nonlabored ventilation, respiratory function stable and patient connected to nasal cannula oxygen Cardiovascular status: blood pressure returned to baseline and stable Postop Assessment: no apparent nausea or vomiting Anesthetic complications: no    Last Vitals:  Vitals:   11/15/19 0930 11/15/19 0945  BP: 108/67 105/67  Pulse: 96 88  Resp: 19 20  Temp:  36.6 C  SpO2: 97% 98%    Last Pain:  Vitals:   11/15/19 0945  TempSrc:   PainSc: 2                  Ariza Evans L Cristin Penaflor

## 2019-11-15 NOTE — Interval H&P Note (Signed)
History and Physical Interval Note:  11/15/2019 8:03 AM  Amanda Rasmussen  has presented today for surgery, with the diagnosis of RADIAL SCAR.  The various methods of treatment have been discussed with the patient and family. After consideration of risks, benefits and other options for treatment, the patient has consented to  Procedure(s): BILATERAL BREAST LUMPECTOMY WITH RADIOACTIVE SEED LOCALIZATION (Bilateral) as a surgical intervention.  The patient's history has been reviewed, patient examined, no change in status, stable for surgery.  I have reviewed the patient's chart and labs.  Questions were answered to the patient's satisfaction.     [name]

## 2019-11-16 ENCOUNTER — Encounter: Payer: Self-pay | Admitting: *Deleted

## 2019-11-16 LAB — SURGICAL PATHOLOGY

## 2019-12-21 DIAGNOSIS — F3341 Major depressive disorder, recurrent, in partial remission: Secondary | ICD-10-CM | POA: Diagnosis not present

## 2020-01-04 DIAGNOSIS — E785 Hyperlipidemia, unspecified: Secondary | ICD-10-CM | POA: Diagnosis not present

## 2020-01-04 DIAGNOSIS — E559 Vitamin D deficiency, unspecified: Secondary | ICD-10-CM | POA: Diagnosis not present

## 2020-01-04 DIAGNOSIS — Z1329 Encounter for screening for other suspected endocrine disorder: Secondary | ICD-10-CM | POA: Diagnosis not present

## 2020-01-04 DIAGNOSIS — Z79899 Other long term (current) drug therapy: Secondary | ICD-10-CM | POA: Diagnosis not present

## 2020-01-04 DIAGNOSIS — D519 Vitamin B12 deficiency anemia, unspecified: Secondary | ICD-10-CM | POA: Diagnosis not present

## 2020-01-04 DIAGNOSIS — D529 Folate deficiency anemia, unspecified: Secondary | ICD-10-CM | POA: Diagnosis not present

## 2020-02-04 DIAGNOSIS — F3342 Major depressive disorder, recurrent, in full remission: Secondary | ICD-10-CM | POA: Diagnosis not present

## 2020-03-21 DIAGNOSIS — J301 Allergic rhinitis due to pollen: Secondary | ICD-10-CM | POA: Diagnosis not present

## 2020-03-21 DIAGNOSIS — K219 Gastro-esophageal reflux disease without esophagitis: Secondary | ICD-10-CM | POA: Diagnosis not present

## 2020-05-07 DIAGNOSIS — J301 Allergic rhinitis due to pollen: Secondary | ICD-10-CM | POA: Diagnosis not present

## 2020-05-07 DIAGNOSIS — J305 Allergic rhinitis due to food: Secondary | ICD-10-CM | POA: Diagnosis not present

## 2020-05-15 DIAGNOSIS — J301 Allergic rhinitis due to pollen: Secondary | ICD-10-CM | POA: Diagnosis not present

## 2020-05-15 DIAGNOSIS — K219 Gastro-esophageal reflux disease without esophagitis: Secondary | ICD-10-CM | POA: Diagnosis not present

## 2020-06-05 DIAGNOSIS — F32A Depression, unspecified: Secondary | ICD-10-CM | POA: Diagnosis not present

## 2020-06-05 DIAGNOSIS — F902 Attention-deficit hyperactivity disorder, combined type: Secondary | ICD-10-CM | POA: Diagnosis not present

## 2020-06-05 DIAGNOSIS — G4709 Other insomnia: Secondary | ICD-10-CM | POA: Diagnosis not present

## 2020-06-05 DIAGNOSIS — F411 Generalized anxiety disorder: Secondary | ICD-10-CM | POA: Diagnosis not present

## 2020-06-16 DIAGNOSIS — F902 Attention-deficit hyperactivity disorder, combined type: Secondary | ICD-10-CM | POA: Diagnosis not present

## 2020-06-19 DIAGNOSIS — F902 Attention-deficit hyperactivity disorder, combined type: Secondary | ICD-10-CM | POA: Diagnosis not present

## 2020-06-19 DIAGNOSIS — F411 Generalized anxiety disorder: Secondary | ICD-10-CM | POA: Diagnosis not present

## 2020-06-19 DIAGNOSIS — G4709 Other insomnia: Secondary | ICD-10-CM | POA: Diagnosis not present

## 2020-06-19 DIAGNOSIS — F32A Depression, unspecified: Secondary | ICD-10-CM | POA: Diagnosis not present

## 2020-08-04 DIAGNOSIS — F5081 Binge eating disorder: Secondary | ICD-10-CM | POA: Diagnosis not present

## 2020-08-04 DIAGNOSIS — F902 Attention-deficit hyperactivity disorder, combined type: Secondary | ICD-10-CM | POA: Diagnosis not present

## 2020-08-04 DIAGNOSIS — F329 Major depressive disorder, single episode, unspecified: Secondary | ICD-10-CM | POA: Diagnosis not present

## 2020-08-04 DIAGNOSIS — F411 Generalized anxiety disorder: Secondary | ICD-10-CM | POA: Diagnosis not present

## 2020-09-01 DIAGNOSIS — Z130 Encounter for screening for diseases of the blood and blood-forming organs and certain disorders involving the immune mechanism: Secondary | ICD-10-CM | POA: Diagnosis not present

## 2020-09-01 DIAGNOSIS — Z131 Encounter for screening for diabetes mellitus: Secondary | ICD-10-CM | POA: Diagnosis not present

## 2020-09-01 DIAGNOSIS — D134 Benign neoplasm of liver: Secondary | ICD-10-CM | POA: Diagnosis not present

## 2020-09-01 DIAGNOSIS — E782 Mixed hyperlipidemia: Secondary | ICD-10-CM | POA: Diagnosis not present

## 2020-09-01 DIAGNOSIS — Z23 Encounter for immunization: Secondary | ICD-10-CM | POA: Diagnosis not present

## 2020-09-01 DIAGNOSIS — K219 Gastro-esophageal reflux disease without esophagitis: Secondary | ICD-10-CM | POA: Diagnosis not present

## 2020-09-01 DIAGNOSIS — F419 Anxiety disorder, unspecified: Secondary | ICD-10-CM | POA: Diagnosis not present

## 2020-09-01 DIAGNOSIS — K449 Diaphragmatic hernia without obstruction or gangrene: Secondary | ICD-10-CM | POA: Diagnosis not present

## 2020-09-01 DIAGNOSIS — F32A Depression, unspecified: Secondary | ICD-10-CM | POA: Diagnosis not present

## 2020-09-01 DIAGNOSIS — Z0000 Encounter for general adult medical examination without abnormal findings: Secondary | ICD-10-CM | POA: Diagnosis not present

## 2020-09-01 DIAGNOSIS — E559 Vitamin D deficiency, unspecified: Secondary | ICD-10-CM | POA: Diagnosis not present

## 2020-09-01 DIAGNOSIS — F5081 Binge eating disorder: Secondary | ICD-10-CM | POA: Diagnosis not present

## 2020-09-03 DIAGNOSIS — F411 Generalized anxiety disorder: Secondary | ICD-10-CM | POA: Diagnosis not present

## 2020-09-03 DIAGNOSIS — F902 Attention-deficit hyperactivity disorder, combined type: Secondary | ICD-10-CM | POA: Diagnosis not present

## 2020-09-03 DIAGNOSIS — F5081 Binge eating disorder: Secondary | ICD-10-CM | POA: Diagnosis not present

## 2020-09-03 DIAGNOSIS — F32A Depression, unspecified: Secondary | ICD-10-CM | POA: Diagnosis not present

## 2020-09-04 DIAGNOSIS — Z1231 Encounter for screening mammogram for malignant neoplasm of breast: Secondary | ICD-10-CM

## 2020-09-11 ENCOUNTER — Other Ambulatory Visit: Payer: Self-pay | Admitting: Obstetrics and Gynecology

## 2020-09-11 DIAGNOSIS — Z1231 Encounter for screening mammogram for malignant neoplasm of breast: Secondary | ICD-10-CM

## 2020-09-24 ENCOUNTER — Other Ambulatory Visit: Payer: Self-pay | Admitting: Specialist

## 2020-09-24 DIAGNOSIS — D134 Benign neoplasm of liver: Secondary | ICD-10-CM

## 2020-09-25 DIAGNOSIS — Z01419 Encounter for gynecological examination (general) (routine) without abnormal findings: Secondary | ICD-10-CM | POA: Diagnosis not present

## 2020-09-26 DIAGNOSIS — D134 Benign neoplasm of liver: Secondary | ICD-10-CM | POA: Diagnosis not present

## 2020-09-30 ENCOUNTER — Ambulatory Visit
Admission: RE | Admit: 2020-09-30 | Discharge: 2020-09-30 | Disposition: A | Discharge status: Home / Self Care | Payer: Federal, State, Local not specified - PPO | Source: Ambulatory Visit

## 2020-09-30 ENCOUNTER — Other Ambulatory Visit: Payer: Self-pay

## 2020-09-30 DIAGNOSIS — Z1231 Encounter for screening mammogram for malignant neoplasm of breast: Secondary | ICD-10-CM

## 2020-09-30 IMAGING — MG MM DIGITAL SCREENING BILAT W/ TOMO AND CAD
8 series · 8 of 24 positions shown · non-contrast
Comparison: Previous exam(s).

CLINICAL DATA: Screening.

EXAM:
DIGITAL SCREENING BILATERAL MAMMOGRAM WITH TOMOSYNTHESIS AND CAD
TECHNIQUE: Bilateral screening digital craniocaudal and mediolateral oblique
mammograms were obtained. Bilateral screening digital breast
tomosynthesis was performed. The images were evaluated with
computer-aided detection.

[L CC synth-2D]
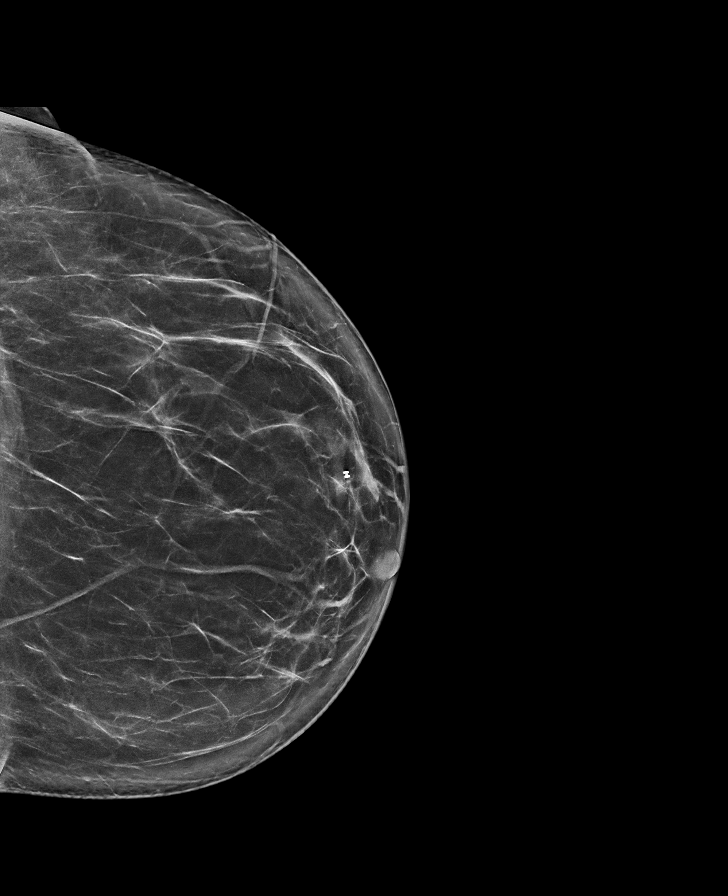

[R CC synth-2D]
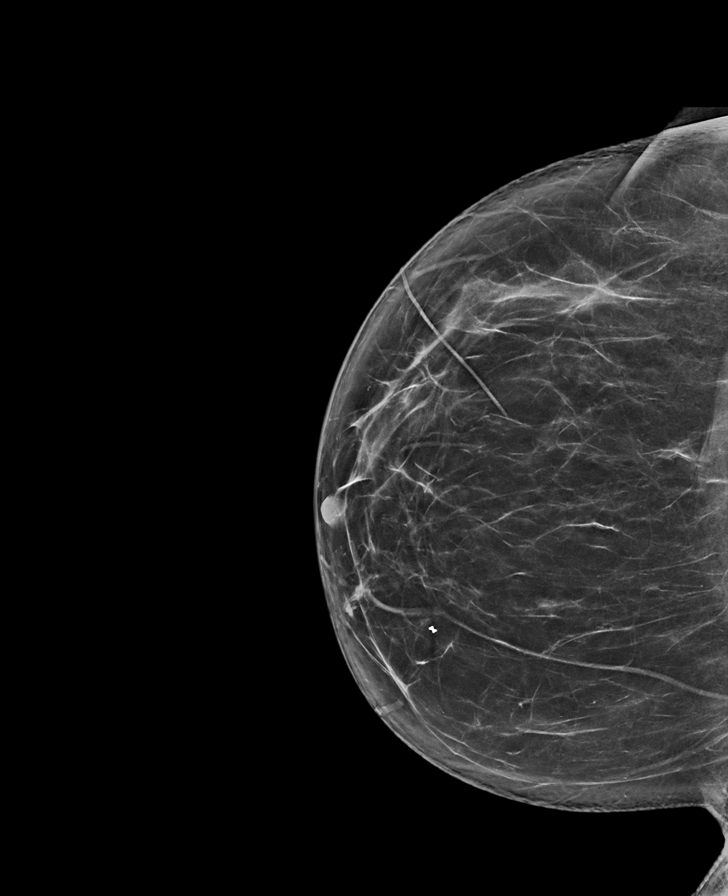

[L MLO synth-2D]
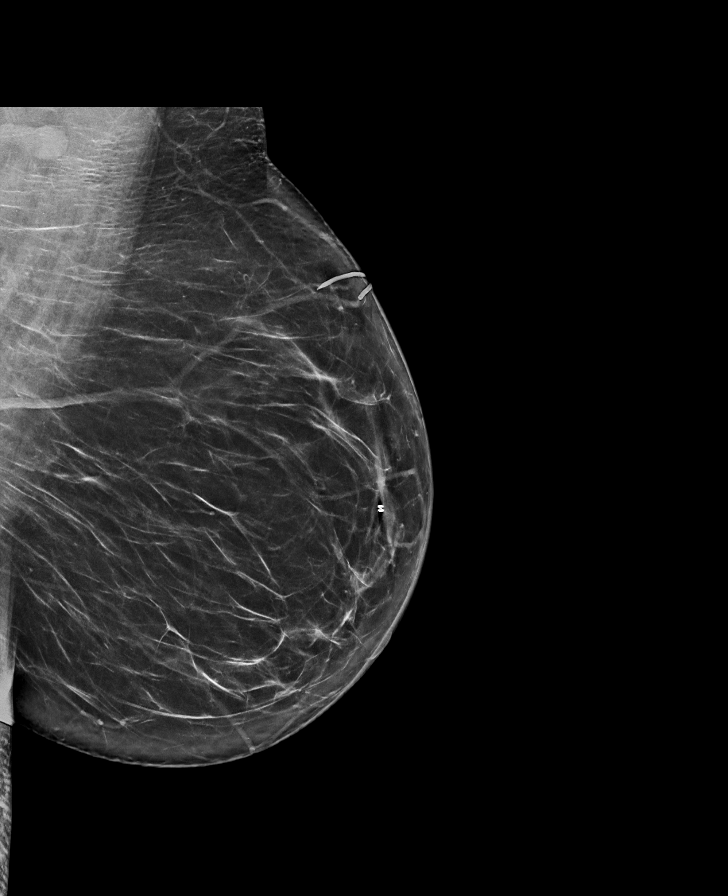

[R MLO synth-2D]
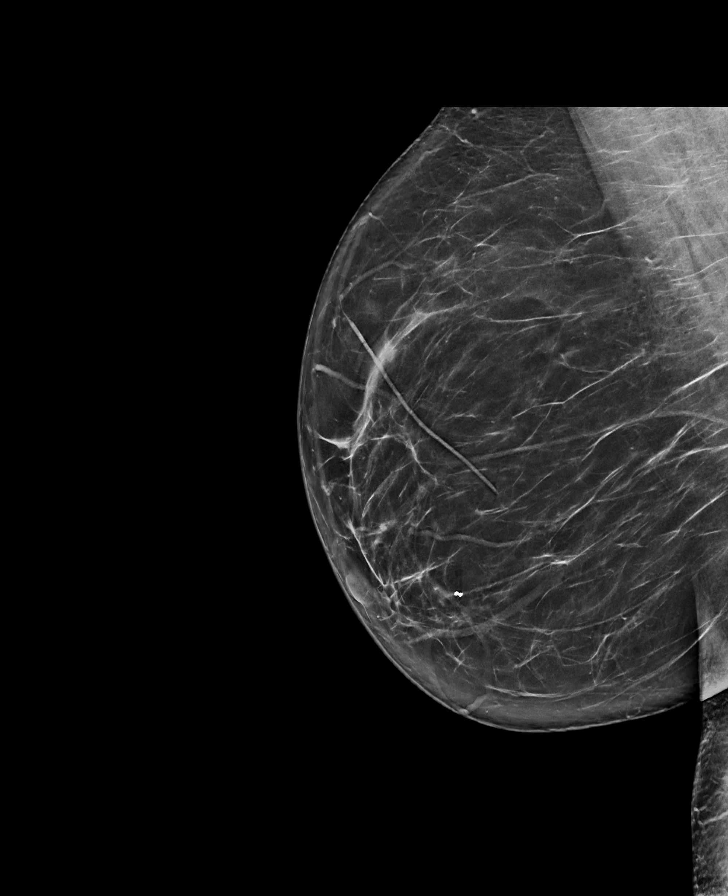

[R CC tomo · tomo slice 38/75.0]
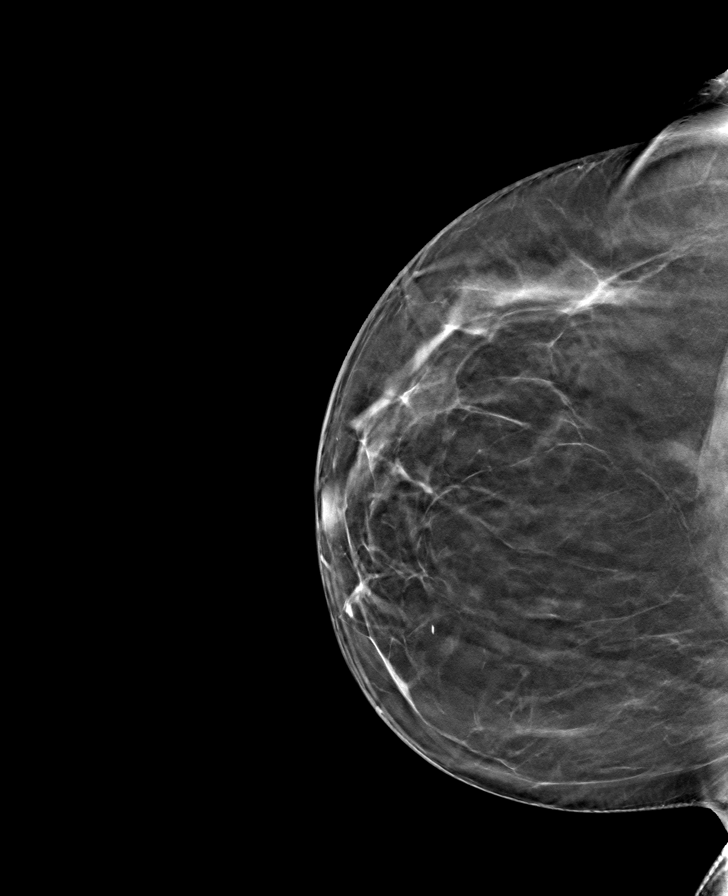

[L CC tomo · tomo slice 39/78.0]
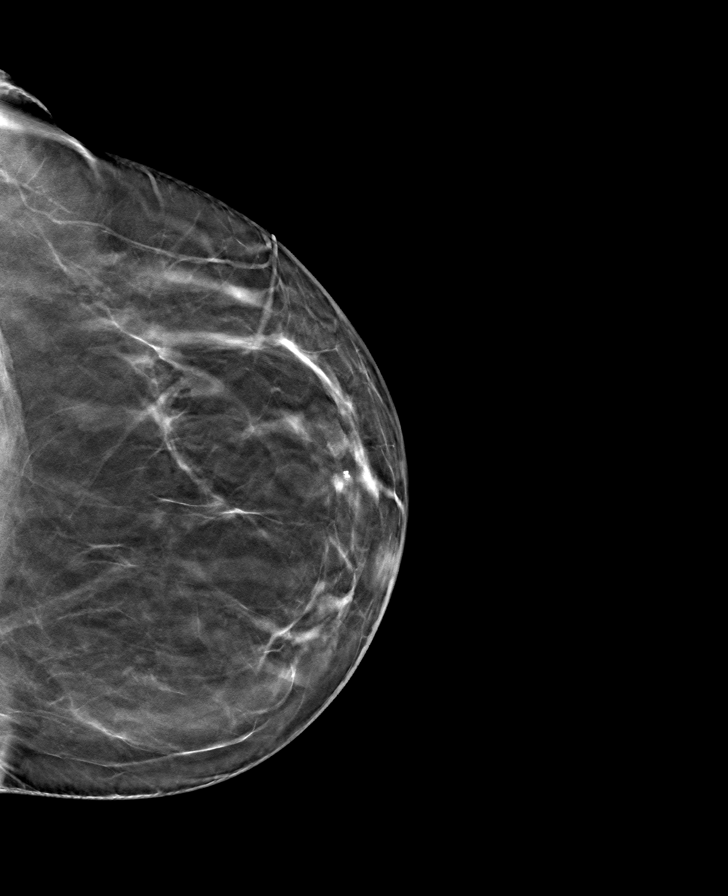

[R MLO tomo · tomo slice 41/80.0]
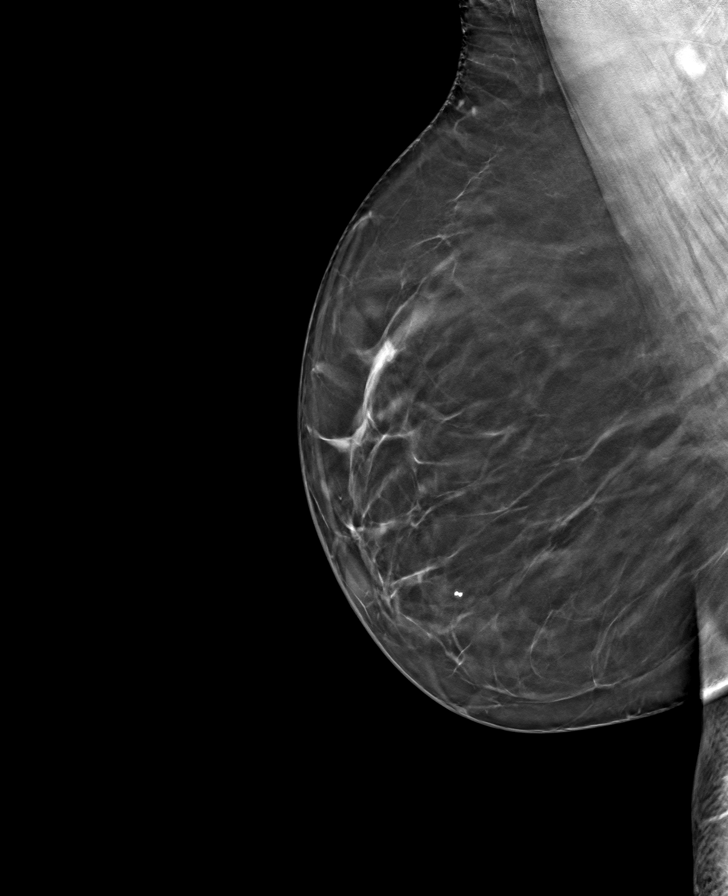

[L MLO tomo · tomo slice 42/83.0]
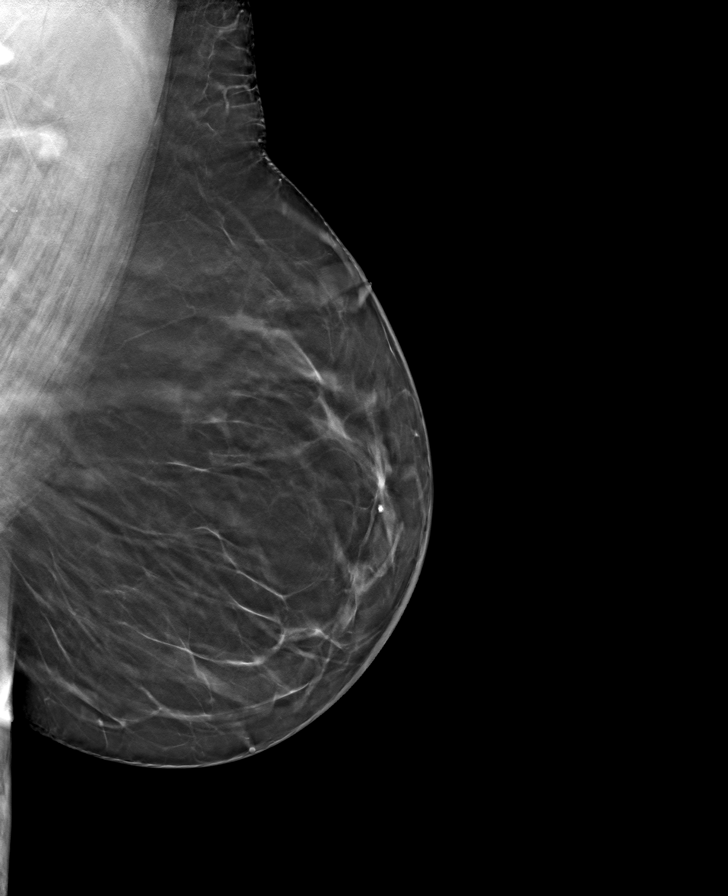

[8 of 24 positions shown; findings below may reference images not displayed]

ACR Breast Density Category b: There are scattered areas of
fibroglandular density.
FINDINGS: There are no findings suspicious for malignancy.
IMPRESSION: No mammographic evidence of malignancy. A result letter of this
screening mammogram will be mailed directly to the patient.

RECOMMENDATION:
Screening mammogram at age 41. (Code:[WO])

BI-RADS CATEGORY  1: Negative.

## 2020-10-01 DIAGNOSIS — G5601 Carpal tunnel syndrome, right upper limb: Secondary | ICD-10-CM | POA: Diagnosis not present

## 2020-10-03 DIAGNOSIS — G5601 Carpal tunnel syndrome, right upper limb: Secondary | ICD-10-CM | POA: Diagnosis not present

## 2020-10-04 ENCOUNTER — Ambulatory Visit
Admission: RE | Admit: 2020-10-04 | Discharge: 2020-10-04 | Disposition: A | Discharge status: Home / Self Care | Payer: Federal, State, Local not specified - PPO | Source: Ambulatory Visit | Attending: Specialist | Admitting: Specialist

## 2020-10-04 ENCOUNTER — Other Ambulatory Visit: Payer: Self-pay

## 2020-10-04 DIAGNOSIS — D134 Benign neoplasm of liver: Secondary | ICD-10-CM

## 2020-10-04 DIAGNOSIS — K449 Diaphragmatic hernia without obstruction or gangrene: Secondary | ICD-10-CM | POA: Diagnosis not present

## 2020-10-04 DIAGNOSIS — K760 Fatty (change of) liver, not elsewhere classified: Secondary | ICD-10-CM | POA: Diagnosis not present

## 2020-10-04 DIAGNOSIS — Z9049 Acquired absence of other specified parts of digestive tract: Secondary | ICD-10-CM | POA: Diagnosis not present

## 2020-10-04 IMAGING — MR MR ABDOMEN WO/W CM
18 series · 48 of 48 positions shown · IV contrast (gadavist)
Comparison: None.

CLINICAL DATA: History of multiple liver adenomas (largest
reportedly 2.8 cm on outside [DATE] MRI report) with history of
liver resection for ruptured giant liver adenoma in [DATE].
Patient presents for routine follow-up, with no current symptoms.

EXAM:
MRI ABDOMEN WITHOUT AND WITH CONTRAST
TECHNIQUE: Multiplanar multisequence MR imaging of the abdomen was performed
both before and after the administration of intravenous contrast.
CONTRAST:  10mL GADAVIST GADOBUTROL 1 MMOL/ML IV SOLN

[Series 2: T2 · coronal · 6.0mm · 1.19mm/px · 2 of 30 slices shown (1 of 2)]
[im 1/30]
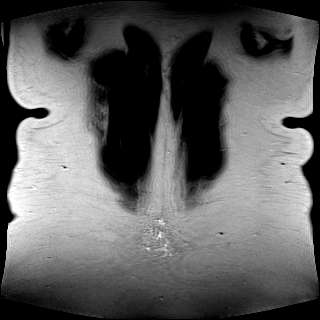
[im 30/30]
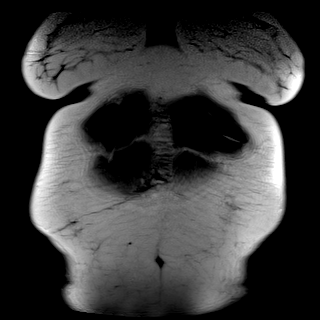

[Series 3: T2 · axial · 6.0mm · 1.19mm/px · z∈[-59,+186]mm · 2 of 35 slices shown (2 of 2)]
[im 1/35]
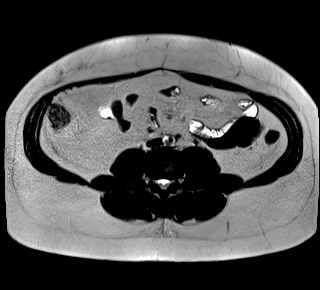
[im 35/35]
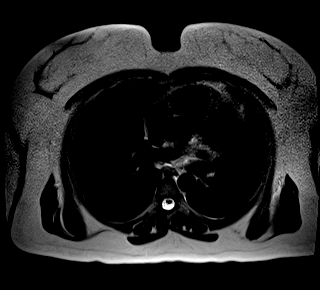

[Series 6: T2 fat-sat · axial · 6.0mm · 1.19mm/px · z∈[-61,+198]mm · 3 of 37 slices shown]
[im 1/37]
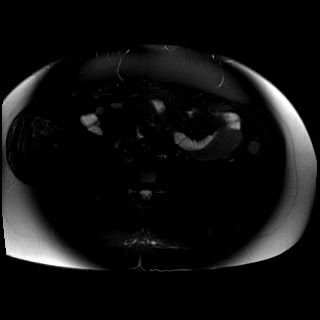
[im 19/37]
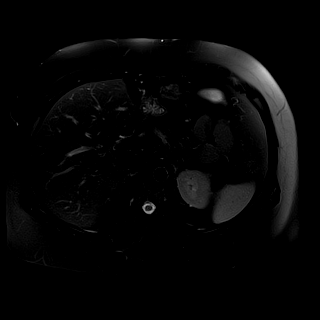
[im 37/37]
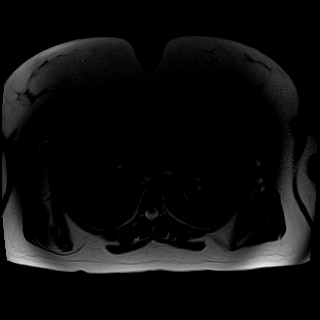

[Series 7: ax dwi_tracew · axial · 6.0mm · 1.42mm/px · z∈[-65,+202]mm · 6 of 114 slices shown]
[im 1/114]
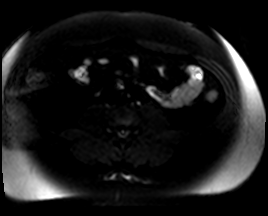
[im 23/114]
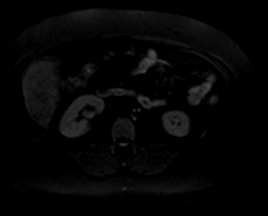
[im 46/114]
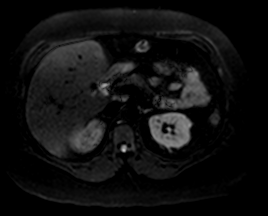
[im 68/114]
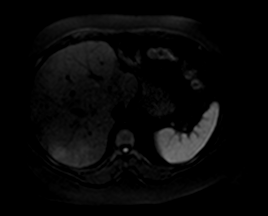
[im 91/114]
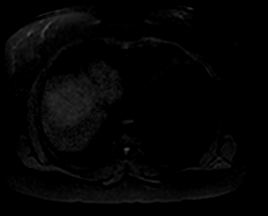
[im 114/114]
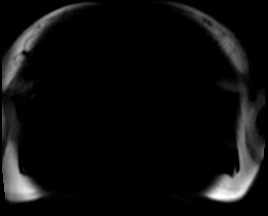

[Series 8: ax dwi_adc · axial · 6.0mm · 1.42mm/px · z∈[-65,+202]mm · 2 of 38 slices shown]
[im 1/38]
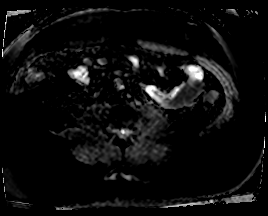
[im 38/38]
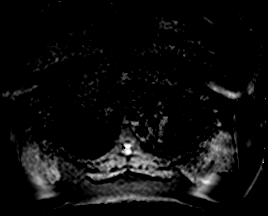

[Series 9: T1 · axial · 6.0mm · 0.74mm/px · 1 of 35 slices shown (1 of 2)]
[im 1/35]
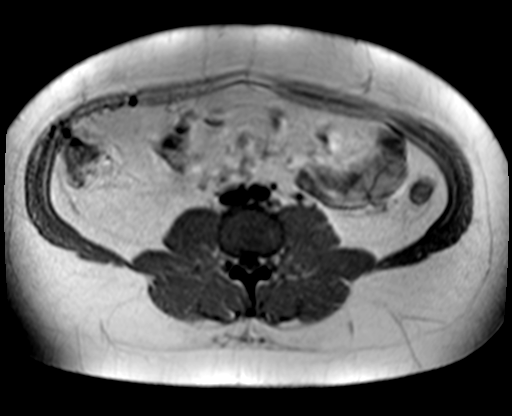

[Series 9: T1 · axial · 6.0mm · 0.74mm/px · 1 of 35 slices shown (2 of 2)]
[im 1/35]
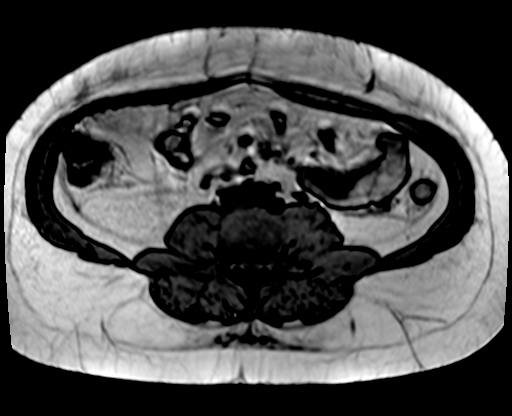

[Series 10: bSSFP · axial · 6.0mm · 0.74mm/px · 1 of 35 slices shown]
[im 1/35]
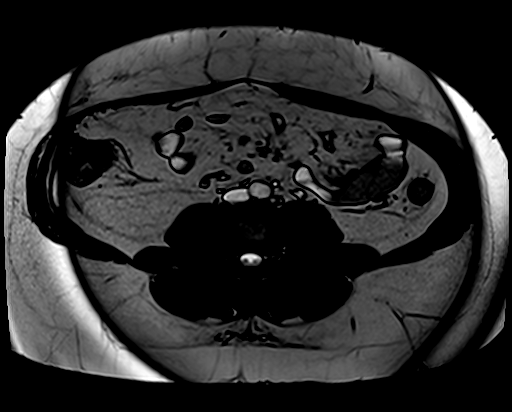

[Series 11: T1 dynamic fat-sat · axial · non-contrast · 3.0mm · 1.19mm/px · z∈[-51,+186]mm · 3 of 80 slices shown (1 of 5)]
[im 1/80]
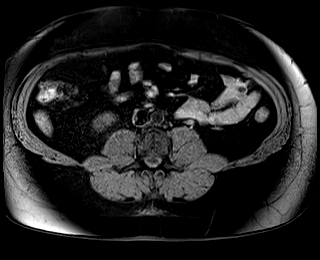
[im 40/80]
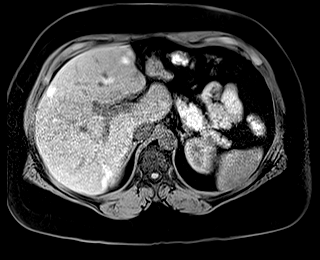
[im 80/80]
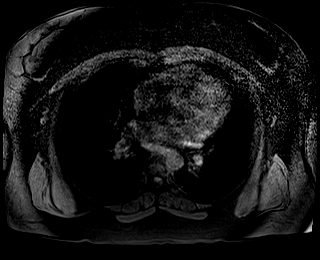

[Series 12: T1 dynamic fat-sat post-contrast · axial · 3.0mm · 1.19mm/px · z∈[-51,+186]mm · 3 of 80 slices shown (1 of 4)]
[im 1/80]
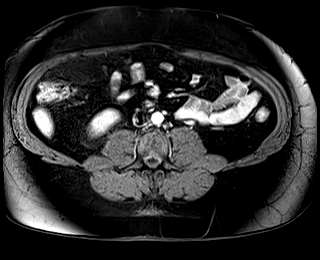
[im 40/80]
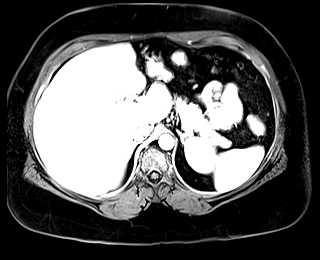
[im 80/80]
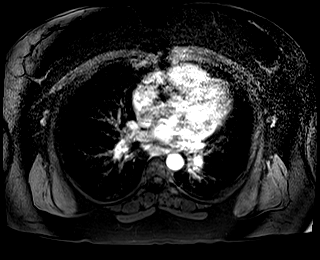

[Series 13: T1 dynamic fat-sat · axial · 3.0mm · 1.19mm/px · z∈[-51,+186]mm · 3 of 80 slices shown (2 of 5)]
[im 1/80]
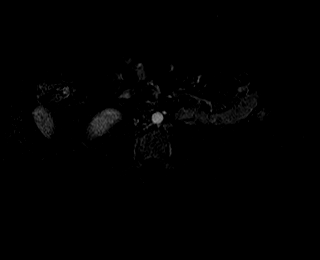
[im 40/80]
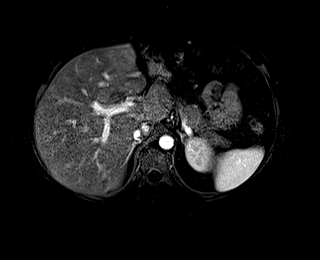
[im 80/80]
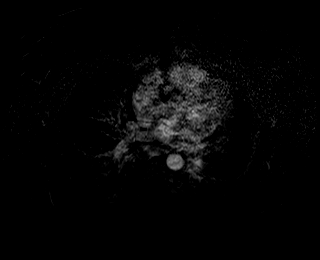

[Series 14: T1 dynamic fat-sat post-contrast · axial · 3.0mm · 1.19mm/px · z∈[-51,+186]mm · 3 of 80 slices shown (2 of 4)]
[im 1/80]
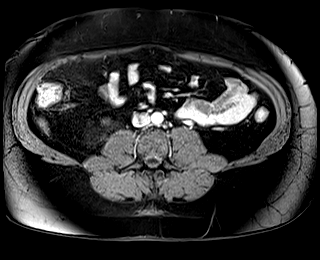
[im 40/80]
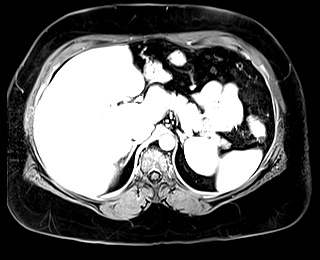
[im 80/80]
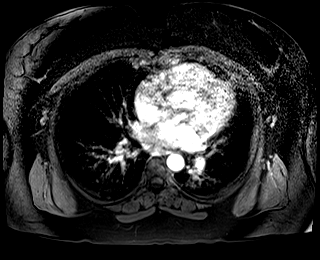

[Series 15: T1 dynamic fat-sat · axial · 3.0mm · 1.19mm/px · z∈[-51,+186]mm · 3 of 80 slices shown (3 of 5)]
[im 1/80]
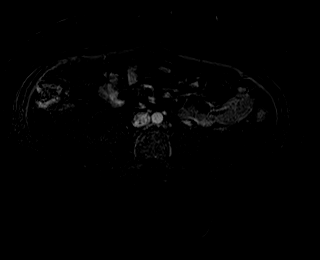
[im 40/80]
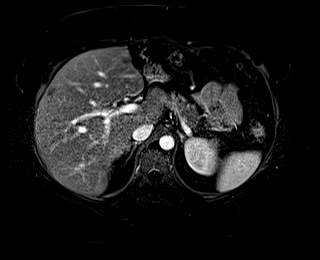
[im 80/80]
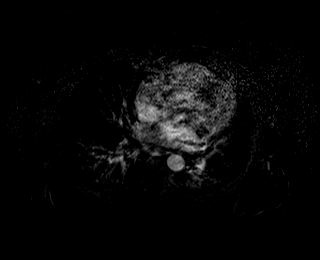

[Series 16: T1 dynamic fat-sat post-contrast · axial · 3.0mm · 1.19mm/px · z∈[-51,+186]mm · 3 of 80 slices shown (3 of 4)]
[im 1/80]
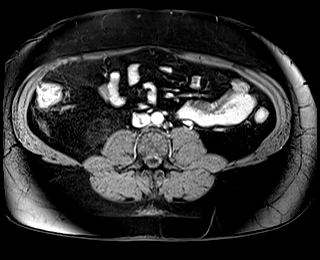
[im 40/80]
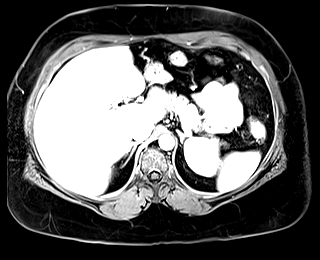
[im 80/80]
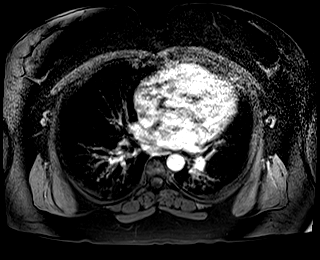

[Series 17: T1 dynamic fat-sat · axial · 3.0mm · 1.19mm/px · z∈[-51,+186]mm · 3 of 80 slices shown (4 of 5)]
[im 1/80]
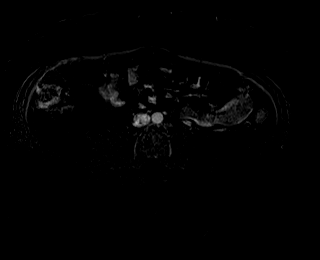
[im 40/80]
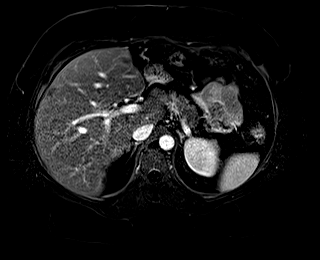
[im 80/80]
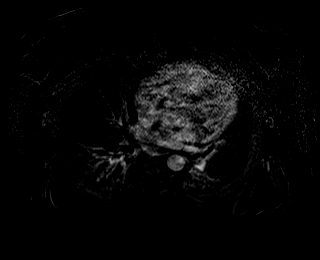

[Series 18: T1 dynamic post-contrast · coronal · 3.0mm · 1.31mm/px · 3 of 72 slices shown]
[im 1/72]
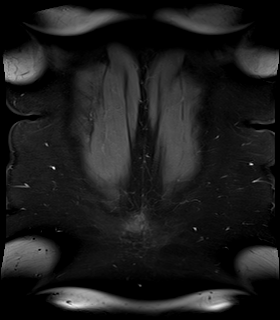
[im 36/72]
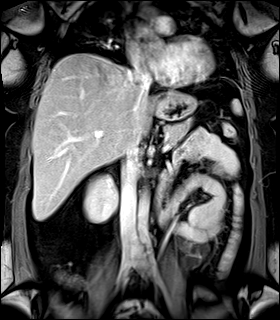
[im 72/72]
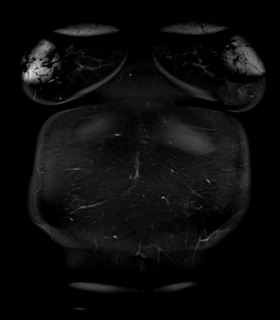

[Series 19: T1 dynamic fat-sat post-contrast · axial · 3.0mm · 1.19mm/px · z∈[-51,+186]mm · 3 of 80 slices shown (4 of 4)]
[im 1/80]
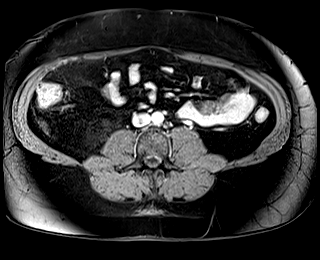
[im 40/80]
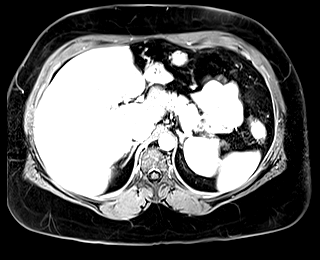
[im 80/80]
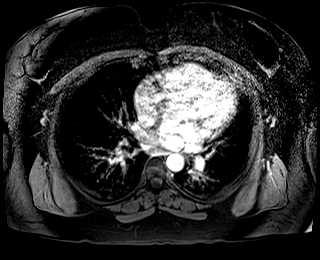

[Series 20: T1 dynamic fat-sat · axial · 3.0mm · 1.19mm/px · z∈[-51,+186]mm · 3 of 80 slices shown (5 of 5)]
[im 1/80]
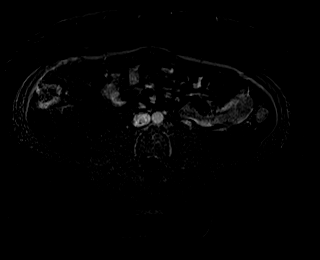
[im 40/80]
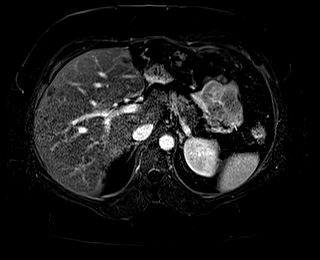
[im 80/80]
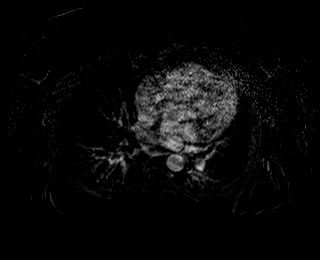

[48 of 48 positions shown; findings below may reference images not displayed]

FINDINGS: Lower chest: No acute abnormality at the lung bases.

Hepatobiliary: Apparent resection of the lateral segment left liver.
Moderate diffuse hepatic steatosis. Numerous (greater than 10)
similar solid liver masses scattered throughout the liver, each
demonstrating mild T1 and T2 hyperintensity, mild arterial
hyperenhancement as best seen on the subtraction sequences and
return to the precontrast appearance on the portal venous images,
compatible with reported history of hepatic adenomas, largest 2.8 x
1.8 cm in the segment 8 right liver (series 12/image 20), 2.3 x
cm peripherally in the segment 8 right liver (series 12/image 24)
and 2.8 x 1.7 cm in segment 7 right liver (series 12/image 35).

Cholecystectomy. No biliary ductal dilatation. Common bile duct
diameter 3 mm. No choledocholithiasis. No biliary masses, strictures
or beading.

Pancreas: No pancreatic mass or duct dilation.  No pancreas divisum.

Spleen: Normal size. No mass.

Adrenals/Urinary Tract: Normal adrenals. No hydronephrosis. Normal
kidneys with no renal mass.

Stomach/Bowel: Small hiatal hernia. Otherwise normal nondistended
stomach. Visualized small and large bowel is normal caliber, with no
bowel wall thickening.

Vascular/Lymphatic: Normal caliber abdominal aorta. Patent portal,
splenic, hepatic and renal veins. No pathologically enlarged lymph
nodes in the abdomen.

Other: No abdominal ascites or focal fluid collection.

Musculoskeletal: No aggressive appearing focal osseous lesions.
IMPRESSION: 1. Numerous (greater than 10) similar hypervascular liver masses
scattered throughout the liver, compatible with reported history of
hepatic adenomas, largest 2.8 cm, stable compared to the reported
largest liver adenoma on the [DATE] outside MRI report.
Follow-up MRI abdomen without and with IV contrast suggested in 12
months.
2. Moderate diffuse hepatic steatosis.
3. Apparent resection of the lateral segment left liver.
4. Small hiatal hernia.

## 2020-10-04 MED ORDER — GADOBUTROL 1 MMOL/ML IV SOLN
10.0000 mL | Freq: Once | INTRAVENOUS | Status: AC | PRN
  Administered 2020-10-04: 10 mL via INTRAVENOUS

## 2020-10-06 DIAGNOSIS — G5601 Carpal tunnel syndrome, right upper limb: Secondary | ICD-10-CM | POA: Diagnosis not present

## 2020-10-06 DIAGNOSIS — G5602 Carpal tunnel syndrome, left upper limb: Secondary | ICD-10-CM | POA: Diagnosis not present

## 2020-11-01 ENCOUNTER — Encounter (HOSPITAL_COMMUNITY): Payer: Self-pay

## 2020-11-28 DIAGNOSIS — F411 Generalized anxiety disorder: Secondary | ICD-10-CM | POA: Diagnosis not present

## 2020-11-28 DIAGNOSIS — F5081 Binge eating disorder: Secondary | ICD-10-CM | POA: Diagnosis not present

## 2020-11-28 DIAGNOSIS — F32A Depression, unspecified: Secondary | ICD-10-CM | POA: Diagnosis not present

## 2020-11-28 DIAGNOSIS — F902 Attention-deficit hyperactivity disorder, combined type: Secondary | ICD-10-CM | POA: Diagnosis not present

## 2020-12-09 DIAGNOSIS — U071 COVID-19: Secondary | ICD-10-CM | POA: Diagnosis not present

## 2020-12-09 DIAGNOSIS — Z20822 Contact with and (suspected) exposure to covid-19: Secondary | ICD-10-CM | POA: Diagnosis not present

## 2021-02-06 DIAGNOSIS — M4602 Spinal enthesopathy, cervical region: Secondary | ICD-10-CM | POA: Diagnosis not present

## 2021-02-06 DIAGNOSIS — M9901 Segmental and somatic dysfunction of cervical region: Secondary | ICD-10-CM | POA: Diagnosis not present

## 2021-02-06 DIAGNOSIS — M542 Cervicalgia: Secondary | ICD-10-CM | POA: Diagnosis not present

## 2021-02-11 DIAGNOSIS — J309 Allergic rhinitis, unspecified: Secondary | ICD-10-CM | POA: Diagnosis not present

## 2021-02-11 DIAGNOSIS — J3489 Other specified disorders of nose and nasal sinuses: Secondary | ICD-10-CM | POA: Diagnosis not present

## 2021-02-11 DIAGNOSIS — J343 Hypertrophy of nasal turbinates: Secondary | ICD-10-CM | POA: Diagnosis not present

## 2021-02-24 DIAGNOSIS — G5601 Carpal tunnel syndrome, right upper limb: Secondary | ICD-10-CM | POA: Diagnosis not present

## 2021-03-02 DIAGNOSIS — F902 Attention-deficit hyperactivity disorder, combined type: Secondary | ICD-10-CM | POA: Diagnosis not present

## 2021-03-02 DIAGNOSIS — F411 Generalized anxiety disorder: Secondary | ICD-10-CM | POA: Diagnosis not present

## 2021-03-02 DIAGNOSIS — F5081 Binge eating disorder: Secondary | ICD-10-CM | POA: Diagnosis not present

## 2021-03-02 DIAGNOSIS — F32A Depression, unspecified: Secondary | ICD-10-CM | POA: Diagnosis not present

## 2021-03-18 DIAGNOSIS — J309 Allergic rhinitis, unspecified: Secondary | ICD-10-CM | POA: Diagnosis not present

## 2021-03-25 DIAGNOSIS — J309 Allergic rhinitis, unspecified: Secondary | ICD-10-CM | POA: Diagnosis not present

## 2021-04-09 DIAGNOSIS — Z7184 Encounter for health counseling related to travel: Secondary | ICD-10-CM | POA: Diagnosis not present

## 2021-06-02 DIAGNOSIS — G5622 Lesion of ulnar nerve, left upper limb: Secondary | ICD-10-CM | POA: Diagnosis not present

## 2021-06-02 DIAGNOSIS — G8918 Other acute postprocedural pain: Secondary | ICD-10-CM | POA: Diagnosis not present

## 2021-06-02 DIAGNOSIS — G5602 Carpal tunnel syndrome, left upper limb: Secondary | ICD-10-CM | POA: Diagnosis not present

## 2021-06-03 DIAGNOSIS — F5081 Binge eating disorder: Secondary | ICD-10-CM | POA: Diagnosis not present

## 2021-06-03 DIAGNOSIS — F32A Depression, unspecified: Secondary | ICD-10-CM | POA: Diagnosis not present

## 2021-06-03 DIAGNOSIS — F411 Generalized anxiety disorder: Secondary | ICD-10-CM | POA: Diagnosis not present

## 2021-06-03 DIAGNOSIS — F902 Attention-deficit hyperactivity disorder, combined type: Secondary | ICD-10-CM | POA: Diagnosis not present

## 2021-06-08 DIAGNOSIS — J3489 Other specified disorders of nose and nasal sinuses: Secondary | ICD-10-CM | POA: Diagnosis not present

## 2021-06-26 DIAGNOSIS — K219 Gastro-esophageal reflux disease without esophagitis: Secondary | ICD-10-CM | POA: Diagnosis not present

## 2021-06-26 DIAGNOSIS — J309 Allergic rhinitis, unspecified: Secondary | ICD-10-CM | POA: Diagnosis not present

## 2021-10-05 ENCOUNTER — Other Ambulatory Visit: Payer: Self-pay | Admitting: Student

## 2021-10-05 ENCOUNTER — Other Ambulatory Visit (HOSPITAL_COMMUNITY): Payer: Self-pay | Admitting: Student

## 2021-10-05 DIAGNOSIS — D134 Benign neoplasm of liver: Secondary | ICD-10-CM

## 2021-10-16 ENCOUNTER — Other Ambulatory Visit: Payer: Self-pay

## 2021-10-16 ENCOUNTER — Ambulatory Visit
Admission: RE | Admit: 2021-10-16 | Discharge: 2021-10-16 | Disposition: A | Discharge status: Home / Self Care | Payer: Federal, State, Local not specified - PPO | Source: Ambulatory Visit | Attending: Student | Admitting: Student

## 2021-10-16 DIAGNOSIS — D134 Benign neoplasm of liver: Secondary | ICD-10-CM | POA: Insufficient documentation

## 2021-10-16 IMAGING — MR MR ABDOMEN WO/W CM
19 series · 48 of 48 positions shown · IV contrast (gadavist)
Comparison: MRI [DATE]

CLINICAL DATA: History of hepatic adenomas, follow-up

EXAM:
MRI ABDOMEN WITHOUT AND WITH CONTRAST
TECHNIQUE: Multiplanar multisequence MR imaging of the abdomen was performed
both before and after the administration of intravenous contrast.
CONTRAST:  10mL GADAVIST GADOBUTROL 1 MMOL/ML IV SOLN

[Series 3: T2 · coronal · 6.0mm · 1.19mm/px · 2 of 30 slices shown (1 of 2)]
[im 1/30]
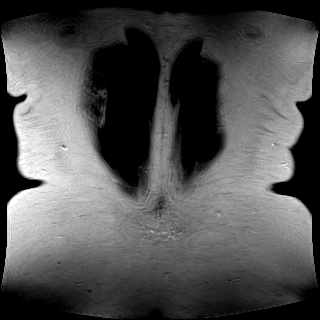
[im 30/30]
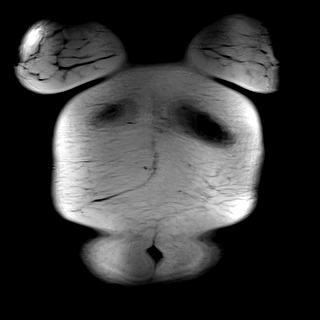

[Series 4: T2 · axial · 6.0mm · 1.19mm/px · z∈[-75,+149]mm · 2 of 32 slices shown (2 of 2)]
[im 1/32]
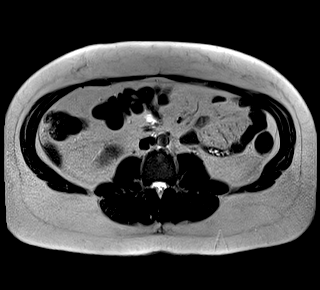
[im 32/32]
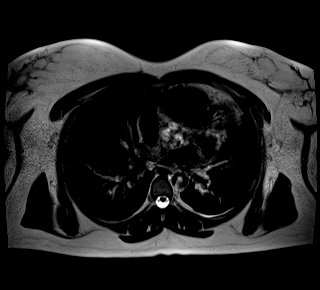

[Series 6: T2 fat-sat · axial · 6.0mm · 1.19mm/px · 1 of 34 slices shown (1 of 2)]
[im 1/34]
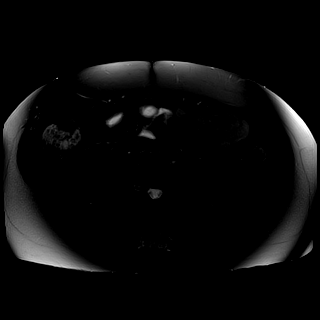

[Series 7: ax dwi_tracew · axial · 6.0mm · 1.42mm/px · z∈[-119,+119]mm · 4 of 102 slices shown]
[im 1/102]
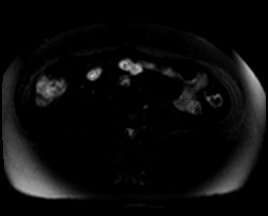
[im 34/102]
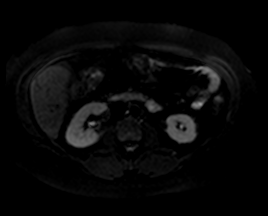
[im 68/102]
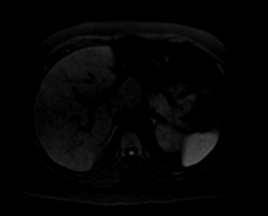
[im 102/102]
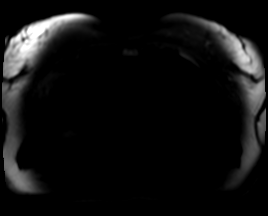

[Series 8: ax dwi_adc · axial · 6.0mm · 1.42mm/px · 1 of 34 slices shown]
[im 1/34]
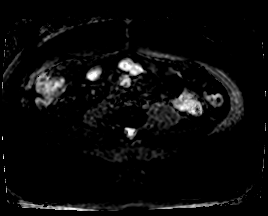

[Series 9: T1 · axial · 3.0mm · 1.19mm/px · z∈[-82,+155]mm · 3 of 80 slices shown (1 of 2)]
[im 1/80]
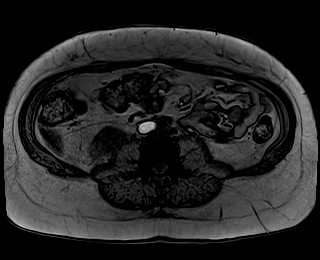
[im 40/80]
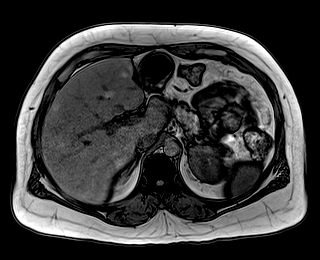
[im 80/80]
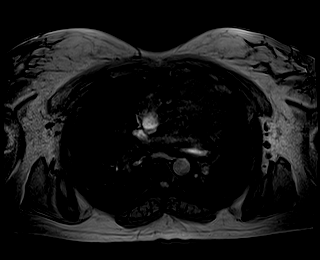

[Series 10: T1 · axial · 3.0mm · 1.19mm/px · z∈[-82,+155]mm · 3 of 80 slices shown (2 of 2)]
[im 1/80]
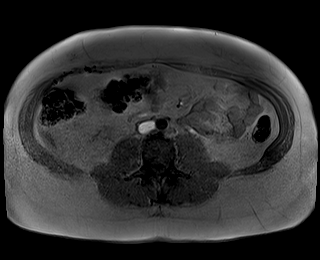
[im 40/80]
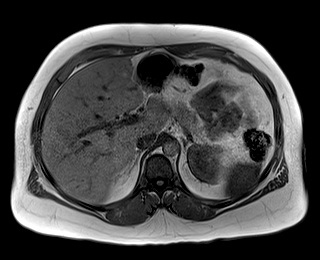
[im 80/80]
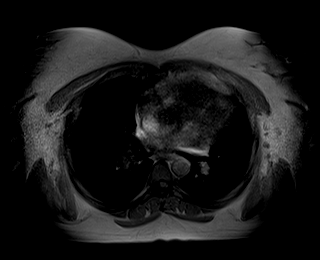

[Series 11: bSSFP · axial · 6.0mm · 0.74mm/px · 1 of 32 slices shown]
[im 1/32]
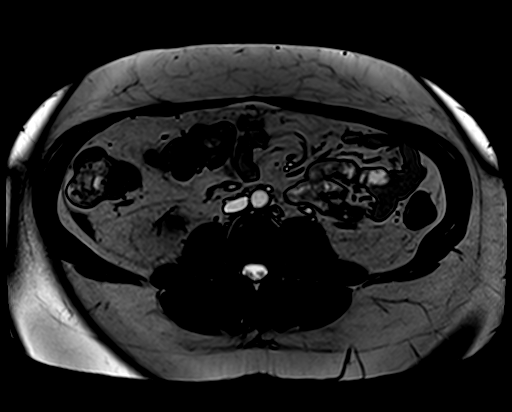

[Series 13: T2 fat-sat · axial · 6.0mm · 1.19mm/px · 1 of 36 slices shown (2 of 2)]
[im 1/36]
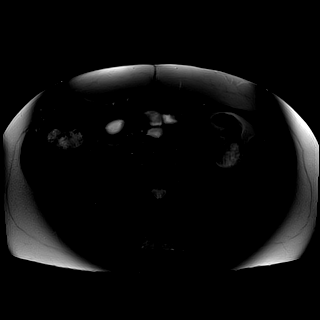

[Series 14: T1 dynamic fat-sat · axial · non-contrast · 3.0mm · 1.19mm/px · z∈[-82,+155]mm · 3 of 80 slices shown (1 of 5)]
[im 1/80]
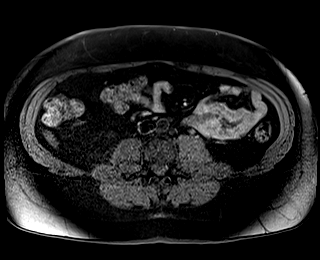
[im 40/80]
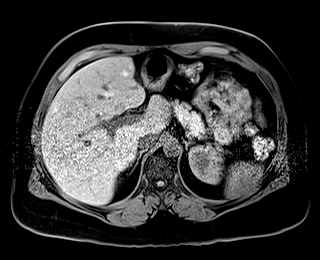
[im 80/80]
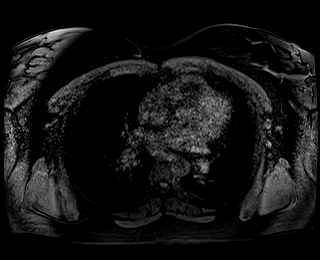

[Series 15: T1 dynamic fat-sat post-contrast · axial · 3.0mm · 1.19mm/px · z∈[-82,+155]mm · 3 of 80 slices shown (1 of 4)]
[im 1/80]
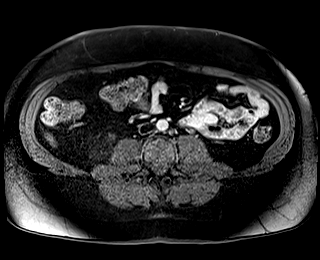
[im 40/80]
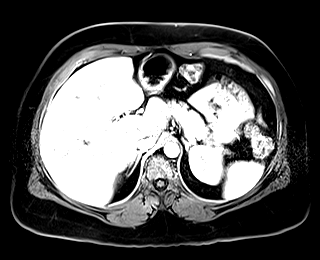
[im 80/80]
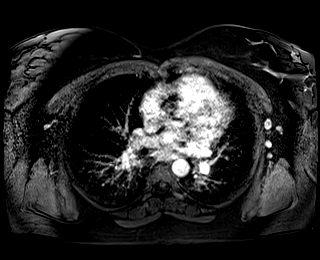

[Series 16: T1 dynamic fat-sat · axial · 3.0mm · 1.19mm/px · z∈[-82,+155]mm · 3 of 80 slices shown (2 of 5)]
[im 1/80]
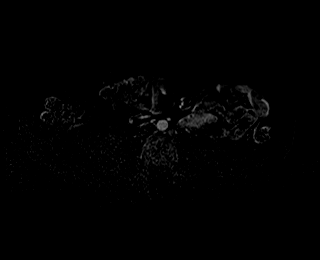
[im 40/80]
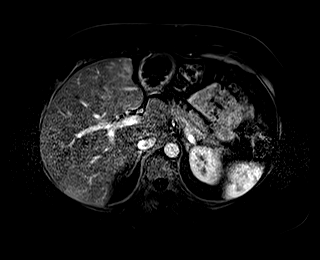
[im 80/80]
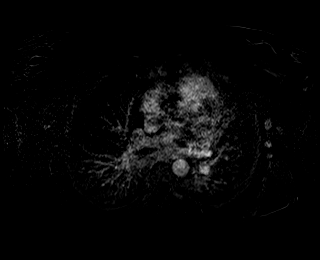

[Series 17: T1 dynamic fat-sat post-contrast · axial · 3.0mm · 1.19mm/px · z∈[-82,+155]mm · 3 of 80 slices shown (2 of 4)]
[im 1/80]
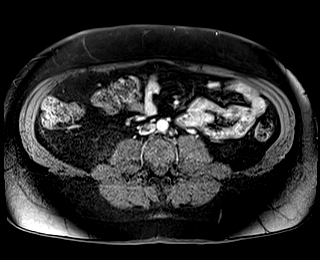
[im 40/80]
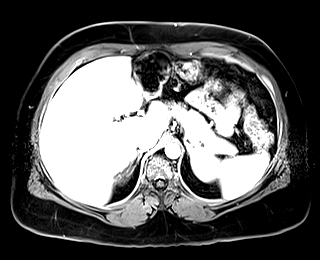
[im 80/80]
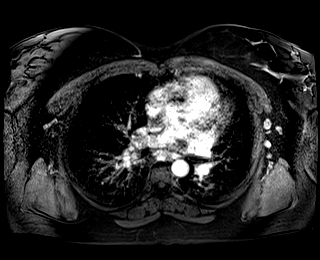

[Series 18: T1 dynamic fat-sat · axial · 3.0mm · 1.19mm/px · z∈[-82,+155]mm · 3 of 80 slices shown (3 of 5)]
[im 1/80]
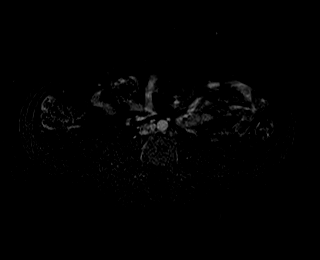
[im 40/80]
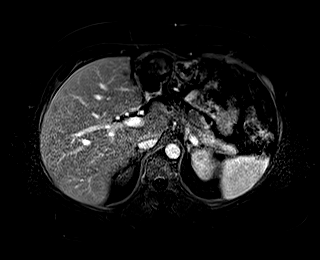
[im 80/80]
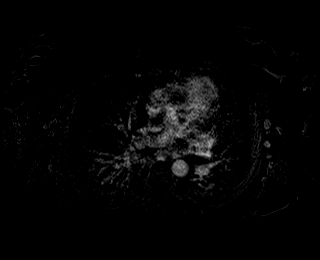

[Series 19: T1 dynamic fat-sat post-contrast · axial · 3.0mm · 1.19mm/px · z∈[-82,+155]mm · 3 of 80 slices shown (3 of 4)]
[im 1/80]
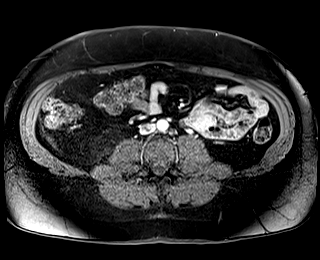
[im 40/80]
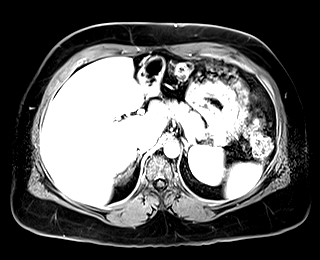
[im 80/80]
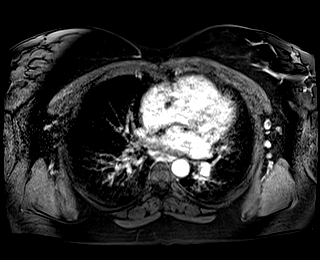

[Series 20: T1 dynamic fat-sat · axial · 3.0mm · 1.19mm/px · z∈[-82,+155]mm · 3 of 80 slices shown (4 of 5)]
[im 1/80]
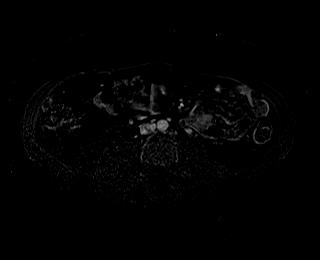
[im 40/80]
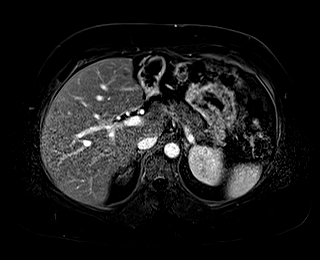
[im 80/80]
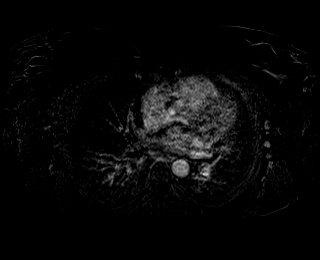

[Series 21: T1 dynamic post-contrast · coronal · 3.0mm · 1.31mm/px · 3 of 72 slices shown]
[im 1/72]
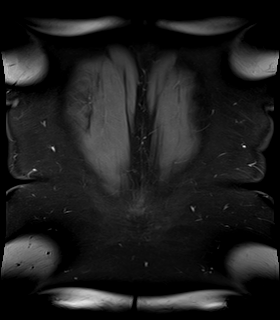
[im 36/72]
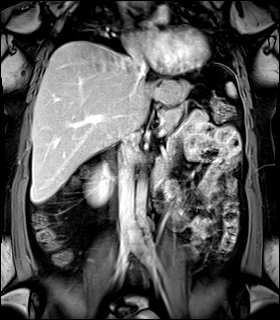
[im 72/72]
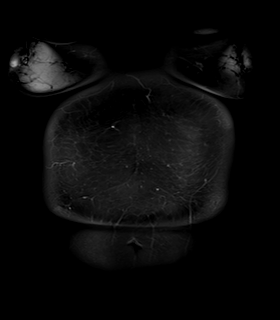

[Series 22: T1 dynamic fat-sat post-contrast · axial · 3.0mm · 1.19mm/px · z∈[-82,+155]mm · 3 of 80 slices shown (4 of 4)]
[im 1/80]
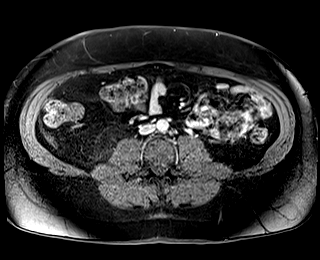
[im 40/80]
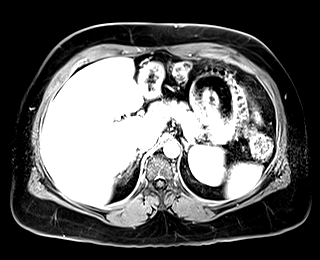
[im 80/80]
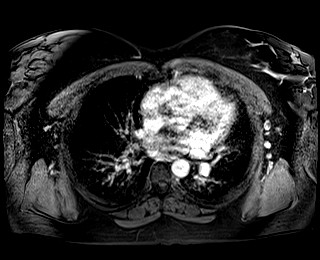

[Series 23: T1 dynamic fat-sat · axial · 3.0mm · 1.19mm/px · z∈[-82,+155]mm · 3 of 80 slices shown (5 of 5)]
[im 1/80]
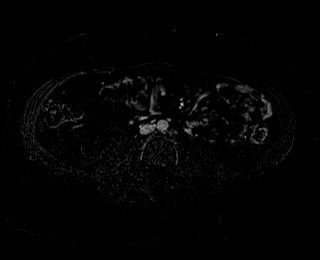
[im 40/80]
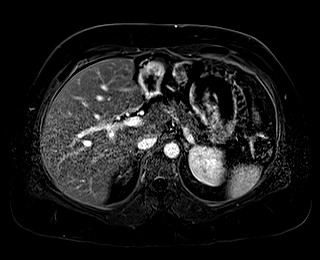
[im 80/80]
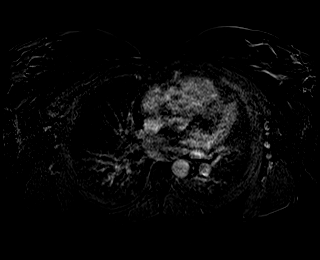

[48 of 48 positions shown; findings below may reference images not displayed]

FINDINGS: Lower chest: No acute abnormality.

Hepatobiliary: Mild diffuse hepatic steatosis. Prior resection of
the lateral left lobe of the liver. Arterially enhancing bilobar
hepatic lesions are again identified with imaging characteristics
consistent with given diagnosis of hepatic adenomas. Previously
index lesions are as follows:

-Central segment VIII lesion measures 2.8 x 1.8 cm on image 19/15,
unchanged from prior.

-Peripheral segment VIII lesion measures 1.9 x 1.5 cm on image 22/15
previously 2.3 x 1.7 cm when remeasured for consistency.

-Segment VII lesion measures 2.3 x 1.7 cm on image 30/15 previously
2.8 x 1.7 cm.

No new lesions identified.

Gallbladder surgically absent.  No biliary ductal dilation.

Pancreas: Or intrinsic T1 signal of the pancreatic parenchyma is
within normal limits. Homogeneous enhancement of the pancreatic
parenchyma postcontrast administration. No pancreatic ductal
dilation. No cystic or solid hyperenhancing pancreatic lesion
identified.

Spleen:  No splenomegaly or focal splenic lesion.

Adrenals/Urinary Tract: Bilateral adrenal glands appear normal. No
hydronephrosis. No solid enhancing renal mass.

Stomach/Bowel: Visualized portions within the abdomen are
unremarkable.

Vascular/Lymphatic: Normal caliber abdominal aorta. The portal,
splenic and superior mesenteric veins are patent.

Other:  No abdominal free fluid.

Musculoskeletal: No suspicious bone lesions identified.
IMPRESSION: 1. Slight interval decrease in size of the bilobar hepatic lesions
compatible with given diagnosis of hepatic adenomas. No new
suspicious liver lesions.
2. Mild diffuse hepatic steatosis.
3. Prior resection of the lateral segment left lobe of the liver.

## 2021-10-16 MED ORDER — GADOBUTROL 1 MMOL/ML IV SOLN
10.0000 mL | Freq: Once | INTRAVENOUS | Status: AC | PRN
  Administered 2021-10-16: 10 mL via INTRAVENOUS

## 2021-12-29 ENCOUNTER — Other Ambulatory Visit: Payer: Self-pay | Admitting: Student

## 2021-12-29 DIAGNOSIS — Z1231 Encounter for screening mammogram for malignant neoplasm of breast: Secondary | ICD-10-CM

## 2021-12-30 ENCOUNTER — Ambulatory Visit
Admission: RE | Admit: 2021-12-30 | Discharge: 2021-12-30 | Disposition: A | Discharge status: Home / Self Care | Payer: Federal, State, Local not specified - PPO | Source: Ambulatory Visit | Attending: Student | Admitting: Student

## 2021-12-30 DIAGNOSIS — Z1231 Encounter for screening mammogram for malignant neoplasm of breast: Secondary | ICD-10-CM

## 2021-12-30 IMAGING — MG MM DIGITAL SCREENING BILAT W/ TOMO AND CAD
8 series · 8 of 24 positions shown · non-contrast
Comparison: Previous exam(s).

CLINICAL DATA: Screening.

EXAM:
DIGITAL SCREENING BILATERAL MAMMOGRAM WITH TOMOSYNTHESIS AND CAD
TECHNIQUE: Bilateral screening digital craniocaudal and mediolateral oblique
mammograms were obtained. Bilateral screening digital breast
tomosynthesis was performed. The images were evaluated with
computer-aided detection.

[R MLO synth-2D]
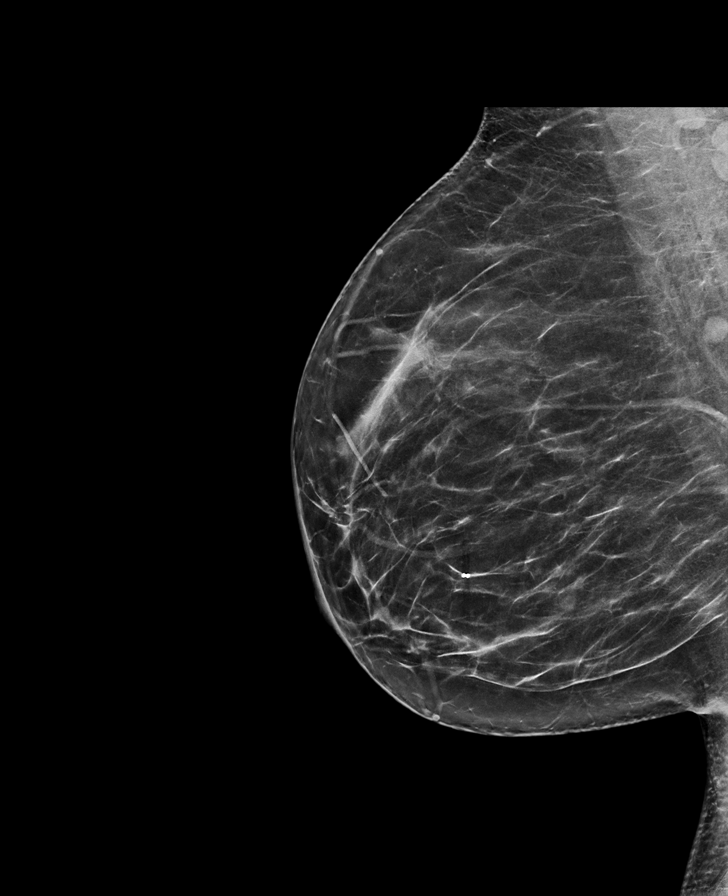

[L CC synth-2D]
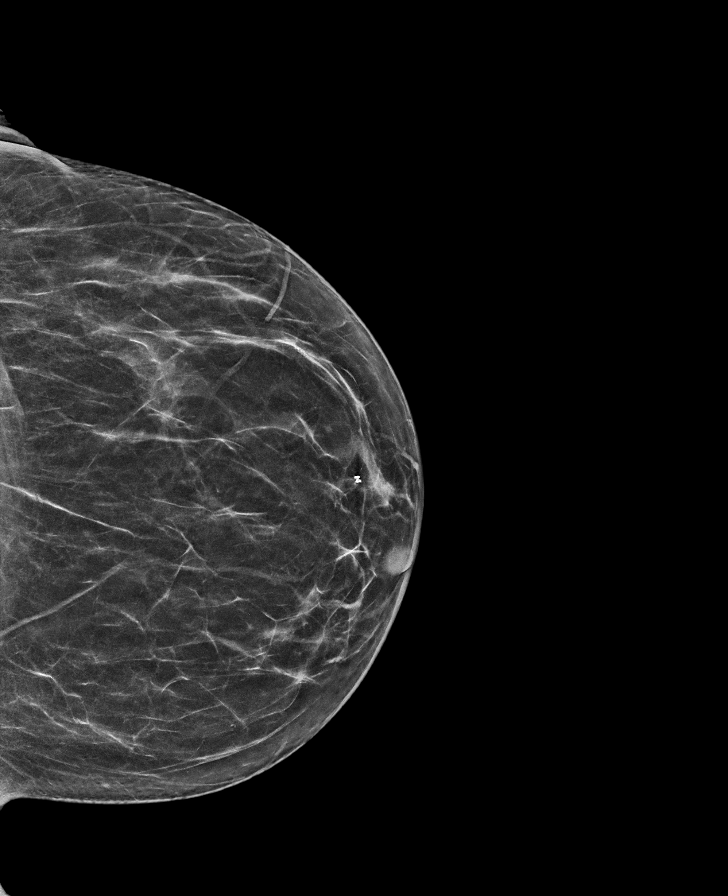

[R CC synth-2D]
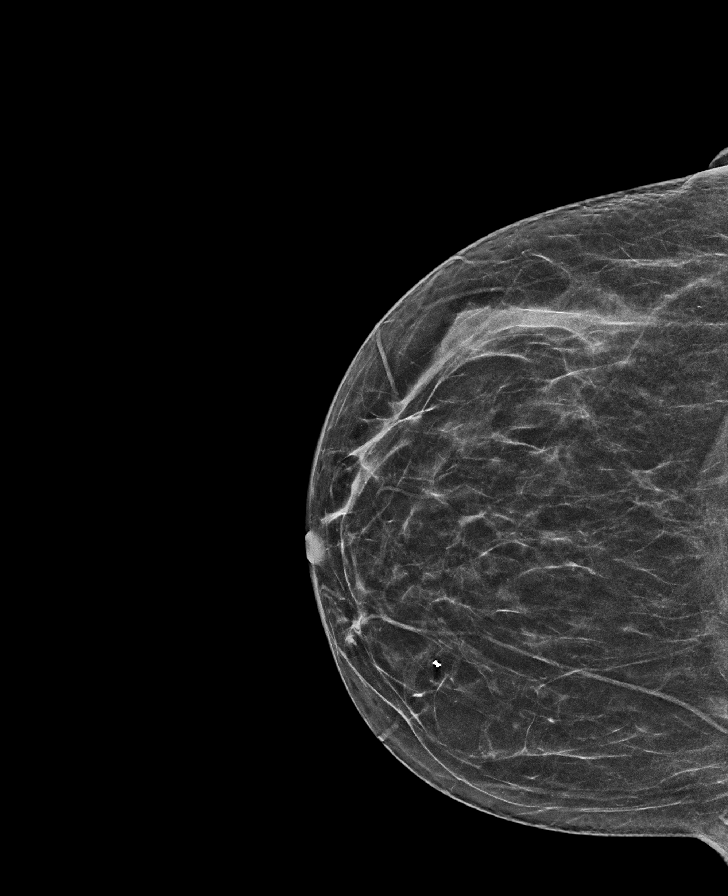

[L MLO synth-2D]
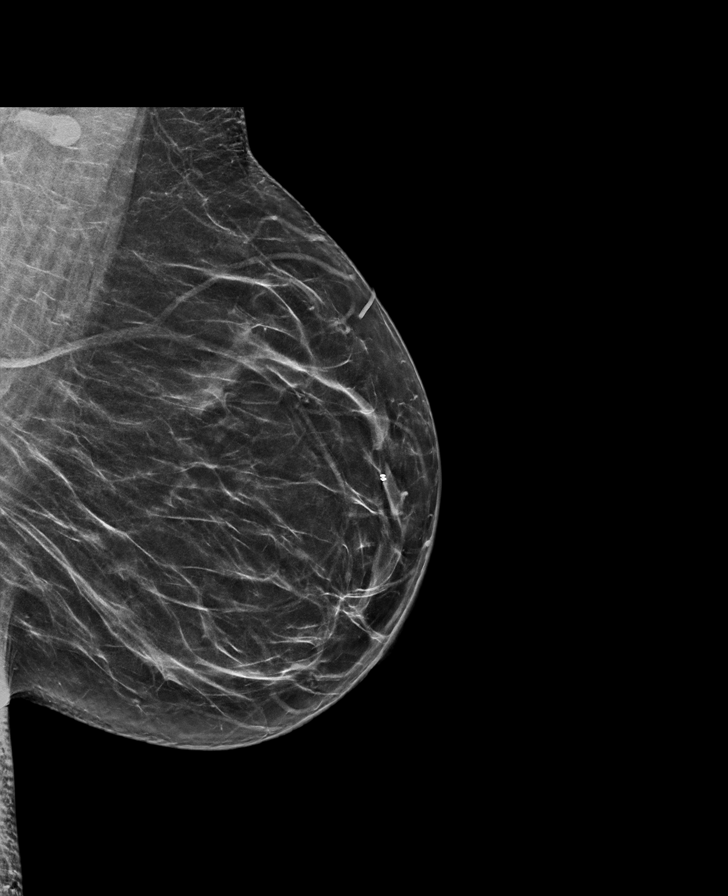

[R CC tomo · tomo slice 28/55.0]
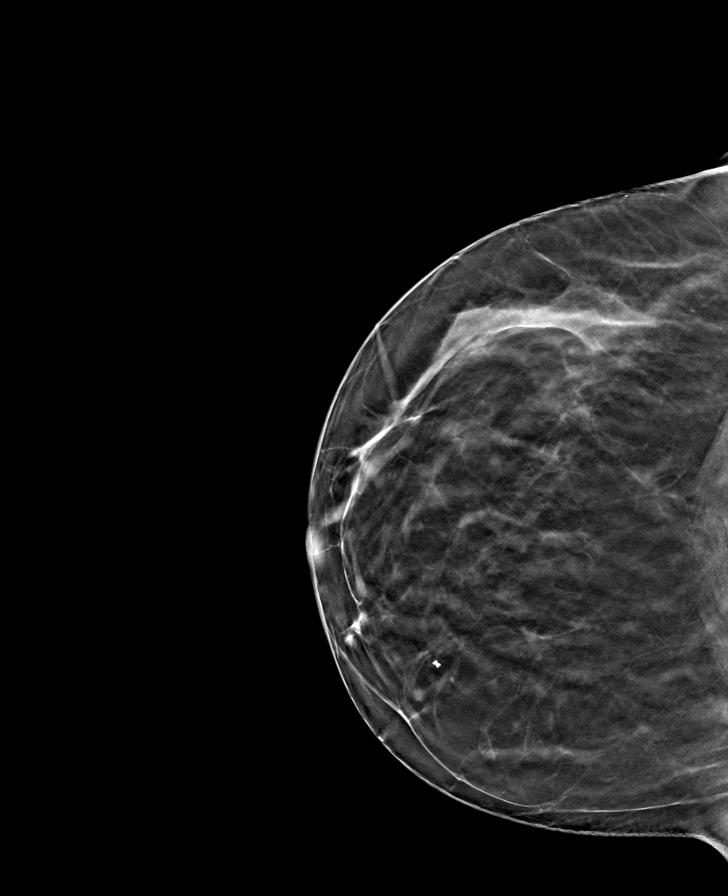

[R MLO tomo · tomo slice 33/65.0]
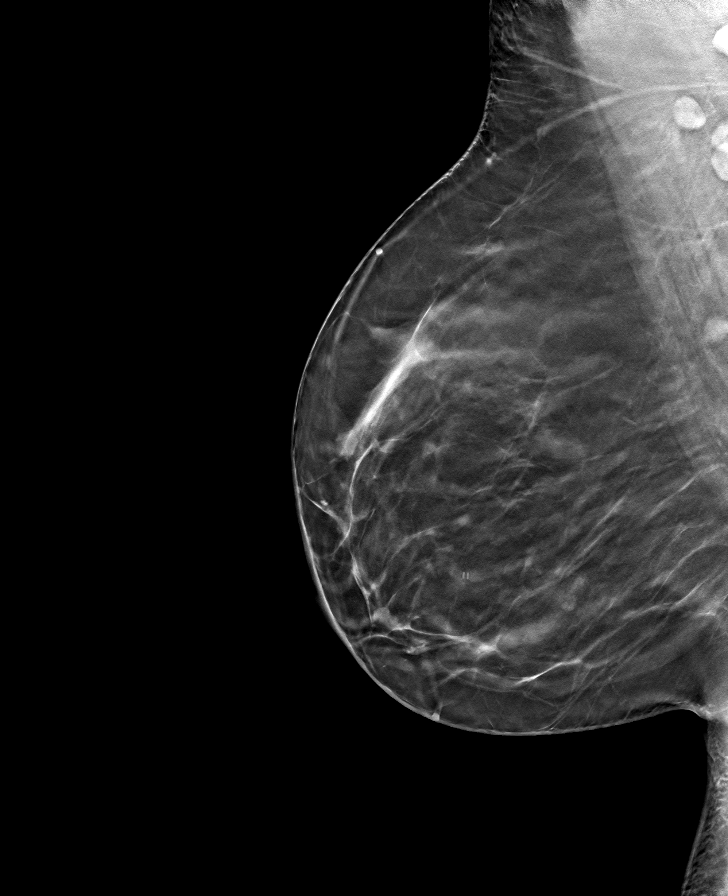

[L MLO tomo · tomo slice 33/64.0]
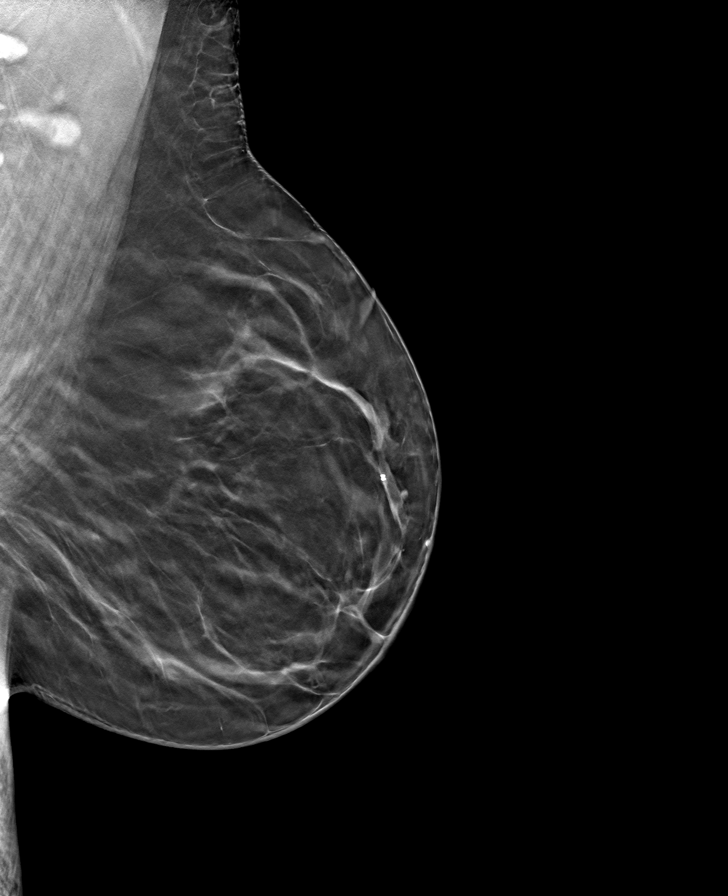

[L CC tomo · tomo slice 29/57.0]
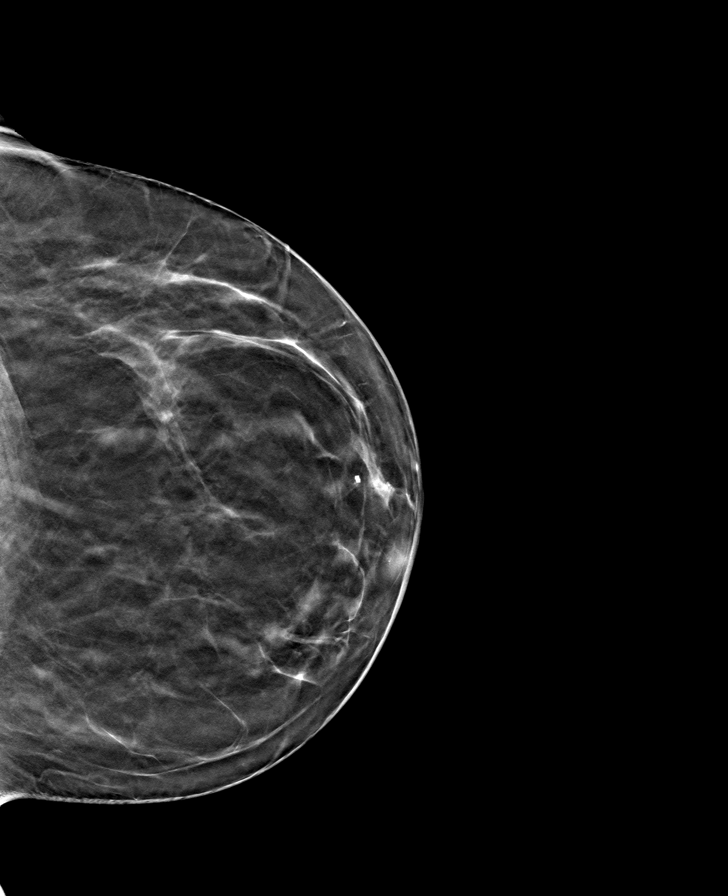

[8 of 24 positions shown; findings below may reference images not displayed]

ACR Breast Density Category b: There are scattered areas of
fibroglandular density.
FINDINGS: There are no findings suspicious for malignancy.
IMPRESSION: No mammographic evidence of malignancy. A result letter of this
screening mammogram will be mailed directly to the patient.

RECOMMENDATION:
Screening mammogram in one year. (Code:[BY])

BI-RADS CATEGORY  1: Negative.

## 2022-09-06 ENCOUNTER — Other Ambulatory Visit: Payer: Self-pay | Admitting: Student

## 2022-09-06 DIAGNOSIS — K7689 Other specified diseases of liver: Secondary | ICD-10-CM

## 2022-09-09 ENCOUNTER — Ambulatory Visit
Admission: RE | Admit: 2022-09-09 | Discharge: 2022-09-09 | Disposition: A | Discharge status: Home / Self Care | Payer: Federal, State, Local not specified - PPO | Source: Ambulatory Visit | Attending: Student | Admitting: Student

## 2022-09-09 DIAGNOSIS — K7689 Other specified diseases of liver: Secondary | ICD-10-CM | POA: Insufficient documentation

## 2022-09-09 MED ORDER — GADOBUTROL 1 MMOL/ML IV SOLN
9.0000 mL | Freq: Once | INTRAVENOUS | Status: AC | PRN
  Administered 2022-09-09: 9 mL via INTRAVENOUS

## 2023-03-21 ENCOUNTER — Other Ambulatory Visit: Payer: Self-pay | Admitting: Student

## 2023-03-21 DIAGNOSIS — Z1231 Encounter for screening mammogram for malignant neoplasm of breast: Secondary | ICD-10-CM

## 2023-03-23 DIAGNOSIS — Z1231 Encounter for screening mammogram for malignant neoplasm of breast: Secondary | ICD-10-CM

## 2023-03-25 ENCOUNTER — Ambulatory Visit
Admission: RE | Admit: 2023-03-25 | Discharge: 2023-03-25 | Disposition: A | Discharge status: Home / Self Care | Payer: Federal, State, Local not specified - PPO | Source: Ambulatory Visit | Attending: Student | Admitting: Student

## 2023-03-25 DIAGNOSIS — Z1231 Encounter for screening mammogram for malignant neoplasm of breast: Secondary | ICD-10-CM

## 2023-09-21 ENCOUNTER — Ambulatory Visit
Admission: RE | Admit: 2023-09-21 | Discharge: 2023-09-21 | Disposition: A | Discharge status: Home / Self Care | Payer: Federal, State, Local not specified - PPO | Source: Ambulatory Visit | Attending: Gastroenterology | Admitting: Gastroenterology

## 2023-09-21 ENCOUNTER — Other Ambulatory Visit: Payer: Self-pay | Admitting: Gastroenterology

## 2023-09-21 DIAGNOSIS — D134 Benign neoplasm of liver: Secondary | ICD-10-CM

## 2023-09-21 DIAGNOSIS — Z9049 Acquired absence of other specified parts of digestive tract: Secondary | ICD-10-CM

## 2023-09-21 MED ORDER — GADOPICLENOL 0.5 MMOL/ML IV SOLN
8.0000 mL | Freq: Once | INTRAVENOUS | Status: AC | PRN
  Administered 2023-09-21: 8 mL via INTRAVENOUS

## 2024-06-26 ENCOUNTER — Other Ambulatory Visit: Payer: Self-pay | Admitting: Student

## 2024-06-26 DIAGNOSIS — Z1231 Encounter for screening mammogram for malignant neoplasm of breast: Secondary | ICD-10-CM

## 2024-07-17 ENCOUNTER — Ambulatory Visit
Admission: RE | Admit: 2024-07-17 | Discharge: 2024-07-17 | Disposition: A | Discharge status: Home / Self Care | Source: Ambulatory Visit | Attending: Student | Admitting: Student

## 2024-07-17 DIAGNOSIS — Z1231 Encounter for screening mammogram for malignant neoplasm of breast: Secondary | ICD-10-CM
